# Patient Record
Sex: Male | Born: 1935
Health system: Southern US, Community
[De-identification: ages and names within clinical notes are randomized; demographics above are authoritative.]

## PROBLEM LIST (undated history)

## (undated) DIAGNOSIS — I4892 Unspecified atrial flutter: Secondary | ICD-10-CM

## (undated) DIAGNOSIS — I10 Essential (primary) hypertension: Secondary | ICD-10-CM

## (undated) DIAGNOSIS — K219 Gastro-esophageal reflux disease without esophagitis: Secondary | ICD-10-CM

## (undated) DIAGNOSIS — E119 Type 2 diabetes mellitus without complications: Secondary | ICD-10-CM

## (undated) DIAGNOSIS — M47812 Spondylosis without myelopathy or radiculopathy, cervical region: Secondary | ICD-10-CM

## (undated) DIAGNOSIS — U071 COVID-19: Secondary | ICD-10-CM

## (undated) DIAGNOSIS — C61 Malignant neoplasm of prostate: Secondary | ICD-10-CM

## (undated) DIAGNOSIS — E785 Hyperlipidemia, unspecified: Secondary | ICD-10-CM

## (undated) DIAGNOSIS — K449 Diaphragmatic hernia without obstruction or gangrene: Secondary | ICD-10-CM

## (undated) HISTORY — DX: Essential (primary) hypertension: I10

## (undated) HISTORY — DX: Hyperlipidemia, unspecified: E78.5

---
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
## 1999-12-10 DIAGNOSIS — C61 Malignant neoplasm of prostate: Secondary | ICD-10-CM

## 1999-12-10 HISTORY — DX: Malignant neoplasm of prostate: C61

## 1999-12-10 HISTORY — PX: INSERTION PROSTATE RADIATION SEED: SUR718

## 2000-08-06 ENCOUNTER — Ambulatory Visit (HOSPITAL_COMMUNITY): Admission: RE | Admit: 2000-08-06 | Discharge: 2000-08-06 | Payer: Self-pay | Admitting: Gastroenterology

## 2001-08-24 ENCOUNTER — Other Ambulatory Visit: Admission: RE | Admit: 2001-08-24 | Discharge: 2001-08-24 | Payer: Self-pay | Admitting: Urology

## 2001-08-24 ENCOUNTER — Encounter (INDEPENDENT_AMBULATORY_CARE_PROVIDER_SITE_OTHER): Payer: Self-pay | Admitting: Specialist

## 2001-09-22 ENCOUNTER — Ambulatory Visit: Admission: RE | Admit: 2001-09-22 | Discharge: 2001-12-21 | Payer: Self-pay | Admitting: Radiation Oncology

## 2011-01-09 ENCOUNTER — Encounter: Payer: Self-pay | Admitting: Internal Medicine

## 2011-01-16 DIAGNOSIS — I10 Essential (primary) hypertension: Secondary | ICD-10-CM

## 2011-01-16 HISTORY — DX: Essential (primary) hypertension: I10

## 2011-04-26 NOTE — Procedures (Signed)
[facility]  Patient:    Charles Boyer, Charles Boyer                        MRN: [id number] Proc. Date: 08/06/00 Adm. Date:  [id number] Disc. Date: [id number] Attending:  Louie Bun CC:         Janae Bridgeman Eloise Harman., M.D.                           Procedure Report  PROCEDURE:  Colonoscopy.  INDICATION FOR PROCEDURE:  Hemoccult positive stools.  DESCRIPTION OF PROCEDURE:  The patient was placed in the left lateral decubitus position and placed on the pulse monitor with continuous low-flow oxygen delivered by nasal cannula.  He was sedated with 50 mg IV Demerol and 5 mg IV Versed.  The Olympus video colonoscope was inserted into the rectum and advanced to the cecum, confirmed by transillumination at McBurneys point and visualization of the ileocecal valve and appendiceal orifice.  The prep was excellent.  The cecum, ascending, transverse, descending, and sigmoid colon all appeared normal with no masses, polyps, diverticula, or other mucosal abnormalities.  The rectum likewise appeared normal, and retroflex view of the anus revealed some small internal hemorrhoids.  The colonoscope was then withdrawn and the patient returned to the recovery room in stable condition. He tolerated the procedure well, and there were no immediate complications.  IMPRESSION:  Internal hemorrhoids, otherwise normal colonoscopy. DD:  08/06/00 TD:  08/07/00 Job: [id number]

## 2013-02-07 ENCOUNTER — Encounter: Payer: Self-pay | Admitting: *Deleted

## 2013-03-04 ENCOUNTER — Encounter: Payer: Self-pay | Admitting: Cardiovascular Disease

## 2013-05-30 ENCOUNTER — Other Ambulatory Visit: Payer: Self-pay | Admitting: Cardiovascular Disease

## 2013-06-01 NOTE — Telephone Encounter (Signed)
Rx was sent to pharmacy electronically. 

## 2014-10-24 ENCOUNTER — Emergency Department (HOSPITAL_COMMUNITY): Payer: Medicare Other

## 2014-10-24 ENCOUNTER — Inpatient Hospital Stay (HOSPITAL_COMMUNITY): Payer: Medicare Other

## 2014-10-24 ENCOUNTER — Inpatient Hospital Stay (HOSPITAL_COMMUNITY)
Admission: EM | Admit: 2014-10-24 | Discharge: 2014-10-26 | DRG: 309 | Disposition: A | Discharge status: Home / Self Care | Payer: Medicare Other | Attending: Cardiology | Admitting: Cardiology

## 2014-10-24 ENCOUNTER — Encounter (HOSPITAL_COMMUNITY): Payer: Self-pay | Admitting: *Deleted

## 2014-10-24 DIAGNOSIS — E871 Hypo-osmolality and hyponatremia: Secondary | ICD-10-CM | POA: Diagnosis present

## 2014-10-24 DIAGNOSIS — R109 Unspecified abdominal pain: Secondary | ICD-10-CM

## 2014-10-24 DIAGNOSIS — M47812 Spondylosis without myelopathy or radiculopathy, cervical region: Secondary | ICD-10-CM | POA: Diagnosis present

## 2014-10-24 DIAGNOSIS — E039 Hypothyroidism, unspecified: Secondary | ICD-10-CM | POA: Diagnosis present

## 2014-10-24 DIAGNOSIS — R079 Chest pain, unspecified: Secondary | ICD-10-CM

## 2014-10-24 DIAGNOSIS — M47892 Other spondylosis, cervical region: Secondary | ICD-10-CM | POA: Diagnosis present

## 2014-10-24 DIAGNOSIS — I4892 Unspecified atrial flutter: Secondary | ICD-10-CM | POA: Diagnosis present

## 2014-10-24 DIAGNOSIS — Z7982 Long term (current) use of aspirin: Secondary | ICD-10-CM

## 2014-10-24 DIAGNOSIS — K449 Diaphragmatic hernia without obstruction or gangrene: Secondary | ICD-10-CM | POA: Diagnosis present

## 2014-10-24 DIAGNOSIS — I4891 Unspecified atrial fibrillation: Principal | ICD-10-CM | POA: Diagnosis present

## 2014-10-24 DIAGNOSIS — R55 Syncope and collapse: Secondary | ICD-10-CM | POA: Diagnosis present

## 2014-10-24 DIAGNOSIS — Z79899 Other long term (current) drug therapy: Secondary | ICD-10-CM

## 2014-10-24 DIAGNOSIS — E119 Type 2 diabetes mellitus without complications: Secondary | ICD-10-CM | POA: Diagnosis present

## 2014-10-24 DIAGNOSIS — Z923 Personal history of irradiation: Secondary | ICD-10-CM | POA: Diagnosis not present

## 2014-10-24 DIAGNOSIS — E785 Hyperlipidemia, unspecified: Secondary | ICD-10-CM | POA: Diagnosis present

## 2014-10-24 DIAGNOSIS — D72829 Elevated white blood cell count, unspecified: Secondary | ICD-10-CM | POA: Diagnosis present

## 2014-10-24 DIAGNOSIS — W19XXXA Unspecified fall, initial encounter: Secondary | ICD-10-CM

## 2014-10-24 DIAGNOSIS — K760 Fatty (change of) liver, not elsewhere classified: Secondary | ICD-10-CM | POA: Diagnosis present

## 2014-10-24 DIAGNOSIS — Z8546 Personal history of malignant neoplasm of prostate: Secondary | ICD-10-CM

## 2014-10-24 DIAGNOSIS — I10 Essential (primary) hypertension: Secondary | ICD-10-CM | POA: Diagnosis present

## 2014-10-24 HISTORY — DX: Spondylosis without myelopathy or radiculopathy, cervical region: M47.812

## 2014-10-24 HISTORY — DX: Gastro-esophageal reflux disease without esophagitis: K21.9

## 2014-10-24 HISTORY — DX: Type 2 diabetes mellitus without complications: E11.9

## 2014-10-24 HISTORY — DX: Diaphragmatic hernia without obstruction or gangrene: K44.9

## 2014-10-24 HISTORY — DX: Malignant neoplasm of prostate: C61

## 2014-10-24 HISTORY — DX: Unspecified atrial flutter: I48.92

## 2014-10-24 LAB — CBC
HCT: 45.5 % (ref 39.0–52.0)
Hemoglobin: 15.3 g/dL (ref 13.0–17.0)
MCH: 31.4 pg (ref 26.0–34.0)
MCHC: 33.6 g/dL (ref 30.0–36.0)
MCV: 93.4 fL (ref 78.0–100.0)
PLATELETS: 167 10*3/uL (ref 150–400)
RBC: 4.87 MIL/uL (ref 4.22–5.81)
RDW: 13.6 % (ref 11.5–15.5)
WBC: 13.4 10*3/uL — AB (ref 4.0–10.5)

## 2014-10-24 LAB — BASIC METABOLIC PANEL
ANION GAP: 14 (ref 5–15)
BUN: 17 mg/dL (ref 6–23)
CHLORIDE: 97 meq/L (ref 96–112)
CO2: 21 meq/L (ref 19–32)
Calcium: 9.1 mg/dL (ref 8.4–10.5)
Creatinine, Ser: 1.45 mg/dL — ABNORMAL HIGH (ref 0.50–1.35)
GFR calc Af Amer: 51 mL/min — ABNORMAL LOW (ref >90)
GFR calc non Af Amer: 44 mL/min — ABNORMAL LOW (ref >90)
Glucose, Bld: 223 mg/dL — ABNORMAL HIGH (ref 70–99)
POTASSIUM: 4.4 meq/L (ref 3.7–5.3)
SODIUM: 132 meq/L — AB (ref 137–147)

## 2014-10-24 LAB — PROTIME-INR
INR: 1.16 (ref 0.00–1.49)
Prothrombin Time: 14.9 seconds (ref 11.6–15.2)

## 2014-10-24 LAB — I-STAT TROPONIN, ED
TROPONIN I, POC: 0 ng/mL (ref 0.00–0.08)
Troponin i, poc: 0 ng/mL (ref 0.00–0.08)

## 2014-10-24 LAB — CBG MONITORING, ED: Glucose-Capillary: 176 mg/dL — ABNORMAL HIGH (ref 70–99)

## 2014-10-24 LAB — HEPATIC FUNCTION PANEL
ALBUMIN: 3.4 g/dL — AB (ref 3.5–5.2)
ALK PHOS: 48 U/L (ref 39–117)
ALT: 36 U/L (ref 0–53)
AST: 38 U/L — ABNORMAL HIGH (ref 0–37)
BILIRUBIN INDIRECT: 1 mg/dL — AB (ref 0.3–0.9)
Bilirubin, Direct: 0.2 mg/dL (ref 0.0–0.3)
Total Bilirubin: 1.2 mg/dL (ref 0.3–1.2)
Total Protein: 7 g/dL (ref 6.0–8.3)

## 2014-10-24 LAB — D-DIMER, QUANTITATIVE: D-Dimer, Quant: 0.8 ug/mL-FEU — ABNORMAL HIGH (ref 0.00–0.48)

## 2014-10-24 LAB — APTT: APTT: 29 s (ref 24–37)

## 2014-10-24 LAB — TROPONIN I: Troponin I: <0.3 ng/mL (ref <0.30)

## 2014-10-24 LAB — LIPASE, BLOOD: Lipase: 33 U/L (ref 11–59)

## 2014-10-24 LAB — MRSA PCR SCREENING: MRSA by PCR: NEGATIVE

## 2014-10-24 LAB — TSH: TSH: 4.13 u[IU]/mL (ref 0.350–4.500)

## 2014-10-24 MED ORDER — DILTIAZEM LOAD VIA INFUSION
15.0000 mg | Freq: Once | INTRAVENOUS | Status: AC
  Administered 2014-10-24: 15 mg via INTRAVENOUS
  Filled 2014-10-24: qty 15

## 2014-10-24 MED ORDER — ACETAMINOPHEN 325 MG PO TABS
650.0000 mg | ORAL_TABLET | ORAL | Status: DC | PRN
  Administered 2014-10-25 (×2): 650 mg via ORAL
  Filled 2014-10-24 (×2): qty 2

## 2014-10-24 MED ORDER — HEPARIN (PORCINE) IN NACL 100-0.45 UNIT/ML-% IJ SOLN
1200.0000 [IU]/h | INTRAMUSCULAR | Status: AC
  Administered 2014-10-24 – 2014-10-25 (×2): 1200 [IU]/h via INTRAVENOUS
  Filled 2014-10-24 (×2): qty 250

## 2014-10-24 MED ORDER — INSULIN ASPART 100 UNIT/ML ~~LOC~~ SOLN
0.0000 [IU] | Freq: Three times a day (TID) | SUBCUTANEOUS | Status: DC
Start: 2014-10-24 — End: 2014-10-26
  Administered 2014-10-24: 3 [IU] via SUBCUTANEOUS
  Administered 2014-10-25: 2 [IU] via SUBCUTANEOUS
  Administered 2014-10-25: 3 [IU] via SUBCUTANEOUS
  Administered 2014-10-25 – 2014-10-26 (×2): 2 [IU] via SUBCUTANEOUS
  Filled 2014-10-24: qty 1

## 2014-10-24 MED ORDER — HYDROCODONE-ACETAMINOPHEN 5-325 MG PO TABS
1.0000 | ORAL_TABLET | ORAL | Status: DC | PRN
  Administered 2014-10-24 (×2): 2 via ORAL
  Filled 2014-10-24 (×2): qty 2

## 2014-10-24 MED ORDER — DILTIAZEM HCL 100 MG IV SOLR
5.0000 mg/h | INTRAVENOUS | Status: AC
  Administered 2014-10-24: 10 mg/h via INTRAVENOUS
  Administered 2014-10-24: 5 mg/h via INTRAVENOUS
  Administered 2014-10-25 (×2): 10 mg/h via INTRAVENOUS

## 2014-10-24 MED ORDER — NITROGLYCERIN 0.4 MG SL SUBL: 0.4000 mg | SUBLINGUAL_TABLET | SUBLINGUAL | Status: DC | PRN

## 2014-10-24 MED ORDER — HEPARIN BOLUS VIA INFUSION
4000.0000 [IU] | Freq: Once | INTRAVENOUS | Status: AC
  Administered 2014-10-24: 4000 [IU] via INTRAVENOUS
  Filled 2014-10-24: qty 4000

## 2014-10-24 MED ORDER — ASPIRIN 81 MG PO CHEW
324.0000 mg | CHEWABLE_TABLET | Freq: Once | ORAL | Status: AC
Start: 2014-10-24 — End: 2014-10-24
  Administered 2014-10-24: 324 mg via ORAL
  Filled 2014-10-24: qty 4

## 2014-10-24 MED ORDER — HYDROMORPHONE HCL 1 MG/ML IJ SOLN
1.0000 mg | Freq: Once | INTRAMUSCULAR | Status: AC
  Administered 2014-10-24: 1 mg via INTRAVENOUS
  Filled 2014-10-24: qty 1

## 2014-10-24 MED ORDER — SODIUM CHLORIDE 0.9 % IJ SOLN
3.0000 mL | Freq: Two times a day (BID) | INTRAMUSCULAR | Status: DC
Start: 2014-10-24 — End: 2014-10-26
  Administered 2014-10-26: 3 mL via INTRAVENOUS

## 2014-10-24 MED ORDER — CHLORHEXIDINE GLUCONATE CLOTH 2 % EX PADS
6.0000 | MEDICATED_PAD | Freq: Once | CUTANEOUS | Status: AC
  Administered 2014-10-24: 6 via TOPICAL

## 2014-10-24 MED ORDER — SODIUM CHLORIDE 0.9 % IV BOLUS (SEPSIS)
500.0000 mL | Freq: Once | INTRAVENOUS | Status: AC
  Administered 2014-10-24: 500 mL via INTRAVENOUS

## 2014-10-24 MED ORDER — SODIUM CHLORIDE 0.9 % IV SOLN
1000.0000 mL | INTRAVENOUS | Status: DC
  Administered 2014-10-24 – 2014-10-25 (×2): 1000 mL via INTRAVENOUS

## 2014-10-24 MED ORDER — PANTOPRAZOLE SODIUM 40 MG PO TBEC
40.0000 mg | DELAYED_RELEASE_TABLET | Freq: Every day | ORAL | Status: DC
  Administered 2014-10-24 – 2014-10-25 (×2): 40 mg via ORAL
  Filled 2014-10-24 (×2): qty 1

## 2014-10-24 MED ORDER — ONDANSETRON HCL 4 MG/2ML IJ SOLN: 4.0000 mg | Freq: Four times a day (QID) | INTRAMUSCULAR | Status: DC | PRN

## 2014-10-24 MED ORDER — INSULIN ASPART 100 UNIT/ML ~~LOC~~ SOLN
0.0000 [IU] | Freq: Every day | SUBCUTANEOUS | Status: DC
  Administered 2014-10-25: 2 [IU] via SUBCUTANEOUS

## 2014-10-24 MED ORDER — SODIUM CHLORIDE 0.9 % IJ SOLN: 3.0000 mL | INTRAMUSCULAR | Status: DC | PRN

## 2014-10-24 MED ORDER — IOHEXOL 350 MG/ML SOLN
100.0000 mL | Freq: Once | INTRAVENOUS | Status: AC | PRN
  Administered 2014-10-24: 100 mL via INTRAVENOUS

## 2014-10-24 MED ORDER — ZOLPIDEM TARTRATE 5 MG PO TABS: 5.0000 mg | ORAL_TABLET | Freq: Every evening | ORAL | Status: DC | PRN

## 2014-10-24 MED ORDER — ALPRAZOLAM 0.25 MG PO TABS: 0.2500 mg | ORAL_TABLET | Freq: Two times a day (BID) | ORAL | Status: DC | PRN

## 2014-10-24 MED ORDER — ALUM & MAG HYDROXIDE-SIMETH 200-200-20 MG/5ML PO SUSP
30.0000 mL | Freq: Four times a day (QID) | ORAL | Status: DC | PRN
  Administered 2014-10-24 – 2014-10-25 (×2): 30 mL via ORAL
  Filled 2014-10-24 (×2): qty 30

## 2014-10-24 MED ORDER — SODIUM CHLORIDE 0.9 % IV SOLN: 250.0000 mL | INTRAVENOUS | Status: DC | PRN

## 2014-10-24 MED ORDER — MORPHINE SULFATE 4 MG/ML IJ SOLN
4.0000 mg | Freq: Once | INTRAMUSCULAR | Status: AC
  Administered 2014-10-24: 4 mg via INTRAVENOUS
  Filled 2014-10-24: qty 1

## 2014-10-24 NOTE — ED Notes (Signed)
Pt reports onset this am of sharp mid chest pains, denies recent cough. Pt reports getting up this am and had syncopal episode. Abrasion noted to right side of forehead. ekg done at triage, HR 140 at triage.

## 2014-10-24 NOTE — ED Notes (Signed)
Notified Dr. Tomi Bamberger pt's BP soft- 90/61. Orders to give 561mL bolus and continue cardiazem drip

## 2014-10-24 NOTE — H&P (Signed)
History and Physical  Patient ID: ZAVIAN SLOWEY MRN: [id number], DOB: 1932-06-18 Date of Encounter: 10/24/2014, 1:34 PM Primary Physician: Jani Gravel, MD Primary Cardiologist: Gwenlyn Found - not seen since 2013  Chief Complaint: chest pain and syncope Reason for Admission: new onset atrial fibrillation  HPI:  Charles Boyer is a 80 y.o. male with a past medical history significant for diabetes, hypertension, hyperlipidemia, and hypothyroidism who presents for evaluation after a syncopal spell this morning with a preceding 3 week history of nausea and chest pain.   He was last evaluated by Dr Gwenlyn Found in 2013 with a myoview which demonstrated EF 74% and no significant ischemia.    3 weeks ago, he ate out a restaurant and had an episode of vomiting following that meal.  Since that time, he has had persistent nausea and midsternal chest pain described as a "brick on his chest" that did not resolve with rest or worsen with activity.  He has a known hiatal hernia that he felt caused his symptoms.  This morning around 4AM, he awoke feeling like he needed to vomit, he stood up to go to the restroom and abruptly lost consciousness.  He fell and hit his head.  He is unsure how long he was unconscious.  When he awoke, he had not vomited, lost bowel or bladder control.  His sensation of nausea had resolved.  He came to the ER for further evaluation.    On arrival, he was found to be in new onset atrial fibrillation with RVR.  He has been placed on Cardizem drip with improvement in heart rates.  Since his syncopal spell, he has had a sharp midsternal chest pain that rates 10/10 different from the pain he was having over the last 3 weeks.  This has been improved with Dilauded.   He has chronic dyspnea on exertion, but remains physically active still working.  Prior to 3 weeks ago,he had no symptoms of chest pain with exertion.  He has had no prior syncope.   Past Medical History  Diagnosis Date  . Dyslipidemia   .  Hypertension 01/16/11    ECHOCARDIOGRAM-NORMAL EF>55% STRESS  MYOCARDIAL PERFUSION 06/29/08 NORMAL.  . Type II diabetes mellitus   . Hiatal hernia   . Prostate cancer 2001    rx w/ seed implantation     Most Recent Cardiac Studies: Myoview 2013- EF 74% with no ischemia   Surgical History:  Past Surgical History  Procedure Laterality Date  . Insertion prostate radiation seed  2001     Home Meds: Prior to Admission medications   Medication Sig Start Date End Date Taking? Authorizing Provider  aspirin 325 MG EC tablet Take 325 mg by mouth daily.   Yes Historical Provider, MD  l-methylfolate-B6-B12 (METANX) 3-35-2 MG TABS Take 1 tablet by mouth 2 (two) times daily.   Yes Historical Provider, MD  LEVOTHYROXINE SODIUM PO Take 0.5 tablets by mouth once a week. On Sunday.     Yes Historical Provider, MD    Allergies:  Allergies  Allergen Reactions  . Byetta 10 Mcg Pen [Exenatide]   . Prednisone Other (See Comments)    MEMORY ISSUES. HALLUCINATIONS.  Marland Kitchen Corticosteroids Other (See Comments)    HALLUCINATIONS, MEMORY ISSUES    History   Social History  . Marital Status: Married    Spouse Name: N/A    Number of Children: N/A  . Years of Education: N/A   Occupational History  . Has [employer]  Social History Main Topics  . Smoking status: Never Smoker   . Smokeless tobacco: Never Used  . Alcohol Use: 0.0 oz/week    0 Not specified per week     Comment: 1-2 drinks/week  . Drug Use: No  . Sexual Activity: Not on file   Other Topics Concern  . Not on file   Social History Narrative   Lives with wife     Family History  Problem Relation Age of Onset  . Cancer - Ovarian    . Heart attack      70's    Review of Systems: General: negative for chills, fever, night sweats or weight changes.  Cardiovascular: negative for edema, orthopnea, paroxysmal nocturnal dyspnea Dermatological: negative for rash Respiratory: negative for cough or wheezing Urologic: negative for  hematuria Abdominal: positive for dysphagia, negative for diarrhea, bright red blood per rectum, melena, or hematemesis Neurologic: negative for visual changes or dizziness All other systems reviewed and are otherwise negative except as noted above.  Labs:   Lab Results  Component Value Date   WBC 13.4* 10/24/2014   HGB 15.3 10/24/2014   HCT 45.5 10/24/2014   MCV 93.4 10/24/2014   PLT 167 10/24/2014     Recent Labs Lab 10/24/14 0955  NA 132*  K 4.4  CL 97  CO2 21  BUN 17  CREATININE 1.45*  CALCIUM 9.1  GLUCOSE 223*    Radiology/Studies:  Ct Head Wo Contrast 10/24/2014   CLINICAL DATA:  Syncope this morning involving a fall with abrasion along the right side of the forehead. Onset this morning of chest pain.  EXAM: CT HEAD WITHOUT CONTRAST  CT CERVICAL SPINE WITHOUT CONTRAST  TECHNIQUE: Multidetector CT imaging of the head and cervical spine was performed following the standard protocol without intravenous contrast. Multiplanar CT image reconstructions of the cervical spine were also generated.  COMPARISON:  None.  FINDINGS: CT HEAD FINDINGS  Focal encephalomalacia in the left frontal lobe has a chronic appearance on images 14-15 of series 2. Periventricular white matter and corona radiata hypodensities favor chronic ischemic microvascular white matter disease. Otherwise, The brainstem, cerebellum, cerebral peduncles, thalamus, basal ganglia, basilar cisterns, and ventricular system appear within normal limits. No intracranial hemorrhage, mass lesion, or acute CVA. Chronic bilateral maxillary and ethmoid sinusitis. Right frontal scalp soft tissue swelling and infiltrative edema.  CT CERVICAL SPINE FINDINGS  Considerable cervical spondylosis with fused facet joints bilaterally at C2-3 and on the left at C4-5. Extensive degenerative multilevel facet arthropathy. 2 mm of grade 1 anterolisthesis of C3 on C4 is attributed to the severe facet arthropathy at this level.  Uncinate and facet  spurring cause osseous foraminal stenosis on the right at C3-4 and C5-6 ; and on the left at C5-6. Posterior osseous ridging at C4-5, C5-6, and C6-7.  No cervical spine fracture or acute subluxation identified. Bilateral carotid atherosclerotic calcification.  IMPRESSION: 1. No acute intracranial findings. 2. Focal left frontal encephalomalacia appears chronic. 3. Periventricular white matter and corona radiata hypodensities favor chronic ischemic microvascular white matter disease. 4. Cervical spondylosis causing osseous foraminal stenosis at several levels. No acute cervical spine findings. 5. Right frontal scalp soft tissue swelling.   Electronically Signed   By: Sherryl Barters M.D.   On: 10/24/2014 11:27   Dg Chest Port 1 View 10/24/2014   CLINICAL DATA:  Chest pain, tachycardia and syncopal episodes.  EXAM: PORTABLE CHEST - 1 VIEW  COMPARISON:  None.  FINDINGS: Normal heart size. No pleural effusion or  edema identified. Atelectasis is identified within the left lung base.  IMPRESSION: 1. Left base atelectasis.   Electronically Signed   By: Kerby Moors M.D.   On: 10/24/2014 10:21     EKG: coarse atrial fibrillation, ventricular rate 140, QRS 78  Physical Exam: Filed Vitals:   10/24/14 1115 10/24/14 1146 10/24/14 1200 10/24/14 1215  BP: 90/61 102/66 99/65 118/66  Pulse: 72  70 64  Temp:      TempSrc:      Resp: _0 SpO2: 93%  96% 93%   General: Well developed, well nourished, in no acute distress. Head: Normocephalic, atraumatic, sclera non-icteric, no xanthomas, nares are without discharge.  Neck: Negative for carotid bruits. JVD not elevated. Lungs: Clear bilaterally to auscultation without wheezes, rales, or rhonchi. Breathing is unlabored. Some Chest wall tenderness Heart: IRRR with S1 S2. No murmurs, rubs, or gallops appreciated. Abdomen: Soft, non-tender, non-distended with normoactive bowel sounds. No hepatomegaly. No rebound/guarding. No obvious abdominal masses. Msk:   Strength and tone appear normal for age. Extremities: No clubbing or cyanosis. No edema.  Distal pedal pulses are 2+ and equal in all 4 extrem bilaterally. Neuro: Alert and oriented X 3. No focal deficit. No facial asymmetry. Moves all extremities spontaneously. Psych:  Responds to questions appropriately with a normal affect.    ASSESSMENT AND PLAN:  1.  New onset atrial fibrillation CHADS2VASC is 6 Will need systemic anticoagulation once ACS ruled out Continue Cardizem drip; titrate as needed for rate control Consider TEE/DCCV prior to discharge   2.  Chest pain Initial POC troponin negative, continue to cycle troponin D-dimer elevated at 0.80 - chest CT pending to R/O PE Heparin  Echo   3. Syncope No driving for 6 months per [facility] Question related to post termination pauses/tachycardia  4.  Hypertension Stable  5.  Elevated glucose Check A1C  6.  Hyperlipidemia Not currently on therapy Fasting lipids in AM  7. Possible GI symptoms Add PPI, f/u with Dr Amedeo Plenty  Signed, Chanetta Marshall, NP student  Patient seen and examined with NP-S Chanetta Marshall. Changes made where appropriate.  Rosaria Ferries, PA-C 10/24/2014 1:34 PM Beeper [phone]  Patient seen and examined and history reviewed. Agree with above findings and plan. 80 yo WM presents with a syncopal episode and new atrial fibrillation. He complains of pain in lower sternal region for the past 3 weeks with a lot of belching. This am complained of nasuea and quickly got up to vomit when he passed out. Found to be in AFib with RVR. No prior history of arrhythmia. Long history of hiatal hernia. Exam reveals irregular pulse. Lungs are clear. No gallop or murmur. Troponin is negative. Chest pain is very atypical and more consistent with GI source. Will start PPI. Will continue IV cardizem for rate control of afib. Start heparin for anticoagulation. With high [place] score will need long term anticoagulation but will cycle cardiac  enzymes to make sure he doesn't need a cath. Check Echo and CTA chest. If afib persists would start oral anticoagulation tomorrow and consider TEE guided DCCV this admit.   Sloan Galentine [place], [place] 10/24/2014 1:49 PM

## 2014-10-24 NOTE — Progress Notes (Signed)
ANTICOAGULATION CONSULT NOTE - Initial Consult  Pharmacy Consult for Heparin  Indication: atrial fibrillation  Allergies  Allergen Reactions  . Byetta 10 Mcg Pen [Exenatide]   . Prednisone Other (See Comments)    MEMORY ISSUES. HALLUCINATIONS.  Marland Kitchen Corticosteroids Other (See Comments)    HALLUCINATIONS, MEMORY ISSUES    Patient Measurements: Height: 5\' 9"  (175.3 cm) Weight: 188 lb 4.4 oz (85.4 kg) IBW/kg (Calculated) : 70.7 Heparin Dosing Weight: 85 kg  Vital Signs: Temp: 98.7 F (37.1 C) (11/16 1756) Temp Source: Oral (11/16 1756) BP: 124/77 mmHg (11/16 1756) Pulse Rate: 128 (11/16 1756)  Labs:  Recent Labs  10/24/14 0955  HGB 15.3  HCT 45.5  PLT 167  APTT 29  LABPROT 14.9  INR 1.16  CREATININE 1.45*    Estimated Creatinine Clearance: 44 mL/min (by C-G formula based on Cr of 1.45).   Medical History: Past Medical History  Diagnosis Date  . Dyslipidemia   . Hypertension 01/16/11    ECHOCARDIOGRAM-NORMAL EF>55% STRESS  MYOCARDIAL PERFUSION 06/29/08 NORMAL.  . Type II diabetes mellitus   . Hiatal hernia   . Prostate cancer 2001    rx w/ seed implantation    Medications:  Prescriptions prior to admission  Medication Sig Dispense Refill Last Dose  . aspirin 325 MG EC tablet Take 325 mg by mouth daily.   10/23/2014 at Unknown time  . l-methylfolate-B6-B12 (METANX) 3-35-2 MG TABS Take 1 tablet by mouth 2 (two) times daily.   10/23/2014 at Unknown time  . LEVOTHYROXINE SODIUM PO Take 0.5 tablets by mouth once a week. On Sunday.  Samples from md   Past Month at Unknown time    Assessment: 12 YOM presents status post syncopal spell and 3 week history of nausea and chest pain found to have new onset atrial fibrillation to start IV heparin per pharmacy dosing. Note CT Angio was negative for pulmonary embolism. H/H are within normal limits. Platelets are on the low side at 167 on admission. INR is 1.16 at baseline. SCR is 1.45 with estimated CrCl ~ 40-45 mL/min. No  bleeding is noted. Patient was not on anticoagulants prior to admission.   Goal of Therapy:  Heparin level 0.3-0.7 units/ml Monitor platelets by anticoagulation protocol: Yes   Plan:  1. Heparin bolus of 4000 units x1.  2. Heparin drip at 1200 units/hr.  3. Heparin level in 8 hours.  4. Daily heparin level and CBC while on therapy.    Sloan Leiter, PharmD, BCPS Clinical Pharmacist [phone] 10/24/2014,6:41 PM

## 2014-10-24 NOTE — ED Provider Notes (Addendum)
CSN: [id number]     Arrival date & time 10/24/14  0932 History   First MD Initiated Contact with Patient 10/24/14 [phone]     Chief Complaint  Patient presents with  . Chest Pain  . Loss of Consciousness    HPI He thinks the symptoms first started with chest pain after eating some food a few weeks ago.  It has not gotten any better however in the last few days the symptoms have gotten worse.  Yesterday he had a fainting spell and hit his head.  The pain is a soreness and when he breathes he gets a sharp pain in his chest.  No history of blood clots, or heart problems in the past.  He has not noticed his heart racing.   Past Medical History  Diagnosis Date  . Dyslipidemia   . Hypertension 01/16/11    ECHOCARDIOGRAM-NORMAL EF>55% STRESS  MYOCARDIAL PERFUSION 06/29/08 NORMAL.   History reviewed. No pertinent past surgical history. History reviewed. No pertinent family history. History  Substance Use Topics  . Smoking status: Not on file  . Smokeless tobacco: Not on file  . Alcohol Use: Not on file    Review of Systems  All other systems reviewed and are negative.     Allergies  Byetta 10 mcg pen; Prednisone; and Corticosteroids  Home Medications   Prior to Admission medications   Medication Sig Start Date End Date Taking? Authorizing Provider  aspirin 325 MG EC tablet Take 325 mg by mouth daily.   Yes Historical Provider, MD  l-methylfolate-B6-B12 (METANX) 3-35-2 MG TABS Take 1 tablet by mouth 2 (two) times daily.   Yes Historical Provider, MD  LEVOTHYROXINE SODIUM PO Take 0.5 tablets by mouth once a week. On Sunday.  Samples from md   Yes Historical Provider, MD   BP 102/66 mmHg  Pulse 72  Temp(Src) 98.1 F (36.7 C) (Oral)  Resp 21  SpO2 93% Physical Exam  Constitutional: He appears well-developed and well-nourished. No distress.  HENT:  Head: Normocephalic.  Right Ear: External ear normal.  Left Ear: External ear normal.  Small contusion abrasion right forehead   Eyes: Conjunctivae are normal. Right eye exhibits no discharge. Left eye exhibits no discharge. No scleral icterus.  Neck: Neck supple. No tracheal deviation present.  Cardiovascular: Intact distal pulses.  An irregularly irregular rhythm present. Tachycardia present.   Pulmonary/Chest: Effort normal and breath sounds normal. No stridor. No respiratory distress. He has no wheezes. He has no rales.  Abdominal: Soft. Bowel sounds are normal. He exhibits no distension. There is tenderness in the epigastric area. There is no rebound and no guarding.  Musculoskeletal: He exhibits tenderness. He exhibits no edema.  Tenderness to palpation midline cervical spine  Neurological: He is alert. He has normal strength. No cranial nerve deficit (no facial droop, extraocular movements intact, no slurred speech) or sensory deficit. He exhibits normal muscle tone. He displays no seizure activity. Coordination normal.  Skin: Skin is warm and dry. No rash noted.  Psychiatric: He has a normal mood and affect.  Nursing note and vitals reviewed.   ED Course  Procedures (including critical care time) Labs Review Labs Reviewed  CBC - Abnormal; Notable for the following:    WBC 13.4 (*)    All other components within normal limits  BASIC METABOLIC PANEL - Abnormal; Notable for the following:    Sodium 132 (*)    Glucose, Bld 223 (*)    Creatinine, Ser 1.45 (*)    GFR  calc non Af Amer 44 (*)    GFR calc Af Amer 51 (*)    All other components within normal limits  APTT  PROTIME-INR  LIPASE, BLOOD  D-DIMER, QUANTITATIVE  I-STAT TROPOININ, ED  I-STAT TROPOININ, ED    Imaging Review Ct Head Wo Contrast  10/24/2014   CLINICAL DATA:  Syncope this morning involving a fall with abrasion along the right side of the forehead. Onset this morning of chest pain.  EXAM: CT HEAD WITHOUT CONTRAST  CT CERVICAL SPINE WITHOUT CONTRAST  TECHNIQUE: Multidetector CT imaging of the head and cervical spine was performed  following the standard protocol without intravenous contrast. Multiplanar CT image reconstructions of the cervical spine were also generated.  COMPARISON:  None.  FINDINGS: CT HEAD FINDINGS  Focal encephalomalacia in the left frontal lobe has a chronic appearance on images 14-15 of series 2. Periventricular white matter and corona radiata hypodensities favor chronic ischemic microvascular white matter disease. Otherwise, The brainstem, cerebellum, cerebral peduncles, thalamus, basal ganglia, basilar cisterns, and ventricular system appear within normal limits. No intracranial hemorrhage, mass lesion, or acute CVA. Chronic bilateral maxillary and ethmoid sinusitis. Right frontal scalp soft tissue swelling and infiltrative edema.  CT CERVICAL SPINE FINDINGS  Considerable cervical spondylosis with fused facet joints bilaterally at C2-3 and on the left at C4-5. Extensive degenerative multilevel facet arthropathy. 2 mm of grade 1 anterolisthesis of C3 on C4 is attributed to the severe facet arthropathy at this level.  Uncinate and facet spurring cause osseous foraminal stenosis on the right at C3-4 and C5-6 ; and on the left at C5-6. Posterior osseous ridging at C4-5, C5-6, and C6-7.  No cervical spine fracture or acute subluxation identified. Bilateral carotid atherosclerotic calcification.  IMPRESSION: 1. No acute intracranial findings. 2. Focal left frontal encephalomalacia appears chronic. 3. Periventricular white matter and corona radiata hypodensities favor chronic ischemic microvascular white matter disease. 4. Cervical spondylosis causing osseous foraminal stenosis at several levels. No acute cervical spine findings. 5. Right frontal scalp soft tissue swelling.   Electronically Signed   By: Sherryl Barters M.D.   On: 10/24/2014 11:27   Ct Cervical Spine Wo Contrast  10/24/2014   CLINICAL DATA:  Syncope this morning involving a fall with abrasion along the right side of the forehead. Onset this morning of  chest pain.  EXAM: CT HEAD WITHOUT CONTRAST  CT CERVICAL SPINE WITHOUT CONTRAST  TECHNIQUE: Multidetector CT imaging of the head and cervical spine was performed following the standard protocol without intravenous contrast. Multiplanar CT image reconstructions of the cervical spine were also generated.  COMPARISON:  None.  FINDINGS: CT HEAD FINDINGS  Focal encephalomalacia in the left frontal lobe has a chronic appearance on images 14-15 of series 2. Periventricular white matter and corona radiata hypodensities favor chronic ischemic microvascular white matter disease. Otherwise, The brainstem, cerebellum, cerebral peduncles, thalamus, basal ganglia, basilar cisterns, and ventricular system appear within normal limits. No intracranial hemorrhage, mass lesion, or acute CVA. Chronic bilateral maxillary and ethmoid sinusitis. Right frontal scalp soft tissue swelling and infiltrative edema.  CT CERVICAL SPINE FINDINGS  Considerable cervical spondylosis with fused facet joints bilaterally at C2-3 and on the left at C4-5. Extensive degenerative multilevel facet arthropathy. 2 mm of grade 1 anterolisthesis of C3 on C4 is attributed to the severe facet arthropathy at this level.  Uncinate and facet spurring cause osseous foraminal stenosis on the right at C3-4 and C5-6 ; and on the left at C5-6. Posterior osseous ridging at C4-5, C5-6,  and C6-7.  No cervical spine fracture or acute subluxation identified. Bilateral carotid atherosclerotic calcification.  IMPRESSION: 1. No acute intracranial findings. 2. Focal left frontal encephalomalacia appears chronic. 3. Periventricular white matter and corona radiata hypodensities favor chronic ischemic microvascular white matter disease. 4. Cervical spondylosis causing osseous foraminal stenosis at several levels. No acute cervical spine findings. 5. Right frontal scalp soft tissue swelling.   Electronically Signed   By: Sherryl Barters M.D.   On: 10/24/2014 11:27   Dg Chest Port 1  View  10/24/2014   CLINICAL DATA:  Chest pain, tachycardia and syncopal episodes.  EXAM: PORTABLE CHEST - 1 VIEW  COMPARISON:  None.  FINDINGS: Normal heart size. No pleural effusion or edema identified. Atelectasis is identified within the left lung base.  IMPRESSION: 1. Left base atelectasis.   Electronically Signed   By: Kerby Moors M.D.   On: 10/24/2014 10:21     EKG Interpretation   Date/Time:  Monday October 24 2014 09:35:14 EST Ventricular Rate:  140 PR Interval:    QRS Duration: 78 QT Interval:  334 QTC Calculation: 509 R Axis:   -10 Text Interpretation:  Atrial flutter with variable A-V block Minimal  voltage criteria for LVH, may be normal variant Abnormal ECG No previous  tracing Confirmed by Jasai Sorg  MD-J, Anmol Fleck ([id number]) on 10/24/2014 9:52:42 AM     Medications  0.9 %  sodium chloride infusion (1,000 mLs Intravenous New Bag/Given 10/24/14 1007)  diltiazem (CARDIZEM) 1 mg/mL load via infusion 15 mg (0 mg Intravenous Stopped 10/24/14 1129)    And  diltiazem (CARDIZEM) 100 mg in dextrose 5 % 100 mL (1 mg/mL) infusion (5 mg/hr Intravenous New Bag/Given 10/24/14 1021)  aspirin chewable tablet 324 mg (324 mg Oral Given 10/24/14 1008)  morphine 4 MG/ML injection 4 mg (4 mg Intravenous Given 10/24/14 1009)  sodium chloride 0.9 % bolus 500 mL (500 mLs Intravenous New Bag/Given 10/24/14 1129)   CRITICAL CARE Performed by: KDTOI,ZTI Total critical care time: 40 Critical care time was exclusive of separately billable procedures and treating other patients. Critical care was necessary to treat or prevent imminent or life-threatening deterioration. Critical care was time spent personally by me on the following activities: development of treatment plan with patient and/or surrogate as well as nursing, discussions with consultants, evaluation of patient's response to treatment, examination of patient, obtaining history from patient or surrogate, ordering and performing treatments and  interventions, ordering and review of laboratory studies, ordering and review of radiographic studies, pulse oximetry and re-evaluation of patient's condition.  MDM   Final diagnoses:  Atrial flutter with rapid ventricular response  Syncope, unspecified syncope type   1204  Pt's heart rate has improved on drip.  Initial cardiac enzymes normal.  Still having intermittent sharp pain in his chest.  Waiting on LFTs and lipase as he did have abdominal ttp.  PE is a consideration as well.  Will CT chest to evaluate further.  No serious injury identified associated with his fall.  Plan on cardiac consultation and hospitalization    Dorie Rank, MD 10/24/14 Humphrey, MD 10/24/14 [phone]

## 2014-10-24 NOTE — ED Notes (Signed)
Ordered meal tray for pt 

## 2014-10-24 NOTE — Progress Notes (Signed)
Spoke with Dr. Tommi Rumps, on call for cardiology, pertaining to patient's recent complaint of 7/10 anterior, center, chest pain with inspiration.  EKG obtained and showed changes from previous EKG.  RN updated Dr. Tommi Rumps pertaining to recent PRNs given for pain, and recent blood pressure taken at about 2000.  Order received to repeat EKG at this time. Nurse tech notified per RN.

## 2014-10-25 ENCOUNTER — Inpatient Hospital Stay (HOSPITAL_COMMUNITY): Payer: Medicare Other

## 2014-10-25 ENCOUNTER — Encounter (HOSPITAL_COMMUNITY): Payer: Self-pay | Admitting: General Practice

## 2014-10-25 DIAGNOSIS — I4891 Unspecified atrial fibrillation: Secondary | ICD-10-CM

## 2014-10-25 DIAGNOSIS — M47812 Spondylosis without myelopathy or radiculopathy, cervical region: Secondary | ICD-10-CM

## 2014-10-25 DIAGNOSIS — K760 Fatty (change of) liver, not elsewhere classified: Secondary | ICD-10-CM | POA: Diagnosis present

## 2014-10-25 DIAGNOSIS — E119 Type 2 diabetes mellitus without complications: Secondary | ICD-10-CM

## 2014-10-25 DIAGNOSIS — D72829 Elevated white blood cell count, unspecified: Secondary | ICD-10-CM | POA: Diagnosis present

## 2014-10-25 DIAGNOSIS — E871 Hypo-osmolality and hyponatremia: Secondary | ICD-10-CM | POA: Diagnosis present

## 2014-10-25 HISTORY — DX: Spondylosis without myelopathy or radiculopathy, cervical region: M47.812

## 2014-10-25 LAB — URINALYSIS, ROUTINE W REFLEX MICROSCOPIC
Bilirubin Urine: NEGATIVE
Glucose, UA: NEGATIVE mg/dL
KETONES UR: NEGATIVE mg/dL
LEUKOCYTES UA: NEGATIVE
NITRITE: NEGATIVE
PH: 6 (ref 5.0–8.0)
Protein, ur: NEGATIVE mg/dL
Urobilinogen, UA: 0.2 mg/dL (ref 0.0–1.0)

## 2014-10-25 LAB — COMPREHENSIVE METABOLIC PANEL
ALK PHOS: 41 U/L (ref 39–117)
ALT: 29 U/L (ref 0–53)
ANION GAP: 14 (ref 5–15)
AST: 26 U/L (ref 0–37)
Albumin: 2.9 g/dL — ABNORMAL LOW (ref 3.5–5.2)
BILIRUBIN TOTAL: 1.3 mg/dL — AB (ref 0.3–1.2)
BUN: 22 mg/dL (ref 6–23)
CHLORIDE: 96 meq/L (ref 96–112)
CO2: 21 mEq/L (ref 19–32)
Calcium: 8.1 mg/dL — ABNORMAL LOW (ref 8.4–10.5)
Creatinine, Ser: 1.61 mg/dL — ABNORMAL HIGH (ref 0.50–1.35)
GFR calc non Af Amer: 39 mL/min — ABNORMAL LOW (ref >90)
GFR, EST AFRICAN AMERICAN: 45 mL/min — AB (ref >90)
Glucose, Bld: 144 mg/dL — ABNORMAL HIGH (ref 70–99)
POTASSIUM: 3.7 meq/L (ref 3.7–5.3)
Sodium: 131 mEq/L — ABNORMAL LOW (ref 137–147)
TOTAL PROTEIN: 6.3 g/dL (ref 6.0–8.3)

## 2014-10-25 LAB — CREATININE, URINE, RANDOM: Creatinine, Urine: 60.89 mg/dL

## 2014-10-25 LAB — URINE MICROSCOPIC-ADD ON

## 2014-10-25 LAB — OSMOLALITY, URINE: OSMOLALITY UR: 299 mosm/kg — AB (ref 390–1090)

## 2014-10-25 LAB — GLUCOSE, CAPILLARY
GLUCOSE-CAPILLARY: 215 mg/dL — AB (ref 70–99)
Glucose-Capillary: 132 mg/dL — ABNORMAL HIGH (ref 70–99)
Glucose-Capillary: 177 mg/dL — ABNORMAL HIGH (ref 70–99)
Glucose-Capillary: 128 mg/dL — ABNORMAL HIGH (ref 70–99)
Glucose-Capillary: 137 mg/dL — ABNORMAL HIGH (ref 70–99)

## 2014-10-25 LAB — TROPONIN I
Troponin I: <0.3 ng/mL (ref <0.30)
Troponin I: <0.3 ng/mL (ref <0.30)

## 2014-10-25 LAB — HEPARIN LEVEL (UNFRACTIONATED)
HEPARIN UNFRACTIONATED: 0.44 [IU]/mL (ref 0.30–0.70)
Heparin Unfractionated: 0.55 IU/mL (ref 0.30–0.70)

## 2014-10-25 LAB — SODIUM, URINE, RANDOM: Sodium, Ur: 29 mEq/L

## 2014-10-25 LAB — HEMOGLOBIN A1C
Hgb A1c MFr Bld: 7 % — ABNORMAL HIGH (ref <5.7)
Mean Plasma Glucose: 154 mg/dL — ABNORMAL HIGH (ref <117)

## 2014-10-25 MED ORDER — OFF THE BEAT BOOK
Freq: Once | Status: AC
  Administered 2014-10-25: 06:00:00
  Filled 2014-10-25: qty 1

## 2014-10-25 MED ORDER — SODIUM CHLORIDE 0.9 % IV SOLN
INTRAVENOUS | Status: DC
  Administered 2014-10-25: 18:00:00 via INTRAVENOUS

## 2014-10-25 MED ORDER — PANTOPRAZOLE SODIUM 40 MG PO TBEC
40.0000 mg | DELAYED_RELEASE_TABLET | Freq: Two times a day (BID) | ORAL | Status: DC
  Administered 2014-10-25 – 2014-10-26 (×3): 40 mg via ORAL
  Filled 2014-10-25 (×3): qty 1

## 2014-10-25 MED ORDER — PANTOPRAZOLE SODIUM 40 MG IV SOLR: 40.0000 mg | Freq: Two times a day (BID) | INTRAVENOUS | Status: DC

## 2014-10-25 MED ORDER — APIXABAN 2.5 MG PO TABS
2.5000 mg | ORAL_TABLET | Freq: Two times a day (BID) | ORAL | Status: DC
  Administered 2014-10-25 – 2014-10-26 (×3): 2.5 mg via ORAL
  Filled 2014-10-25 (×3): qty 1

## 2014-10-25 MED ORDER — DILTIAZEM HCL 60 MG PO TABS
60.0000 mg | ORAL_TABLET | Freq: Four times a day (QID) | ORAL | Status: DC
  Administered 2014-10-25 – 2014-10-26 (×5): 60 mg via ORAL
  Filled 2014-10-25 (×5): qty 1

## 2014-10-25 NOTE — Progress Notes (Signed)
ANTICOAGULATION CONSULT NOTE - Follow Up Consult  Pharmacy Consult for Heparin  Indication: atrial fibrillation  Allergies  Allergen Reactions  . Byetta 10 Mcg Pen [Exenatide]   . Prednisone Other (See Comments)    MEMORY ISSUES. HALLUCINATIONS.  Marland Kitchen Corticosteroids Other (See Comments)    HALLUCINATIONS, MEMORY ISSUES    Patient Measurements: Height: 5\' 9"  (175.3 cm) Weight: 188 lb 4.4 oz (85.4 kg) IBW/kg (Calculated) : 70.7  Vital Signs: Temp: 97.6 F (36.4 C) (11/16 2340) Temp Source: Oral (11/16 1756) BP: 121/55 mmHg (11/16 2340) Pulse Rate: 72 (11/16 2340)  Labs:  Recent Labs  10/24/14 0955 10/24/14 2039 10/25/14 0033 10/25/14 0236  HGB 15.3  --   --   --   HCT 45.5  --   --   --   PLT 167  --   --   --   APTT 29  --   --   --   LABPROT 14.9  --   --   --   INR 1.16  --   --   --   HEPARINUNFRC  --   --   --  0.55  CREATININE 1.45*  --   --   --   TROPONINI  --  <0.30 <0.30  --     Estimated Creatinine Clearance: 44 mL/min (by C-G formula based on Cr of 1.45).  Assessment: Heparin for afib. First HL is good at 0.55. Other labs as above.   Goal of Therapy:  Heparin level 0.3-0.7 units/ml Monitor platelets by anticoagulation protocol: Yes   Plan:  -Continue heparin at 1200 units/hr -1100 HL -Daily CBC/HL -Monitor for bleeding  Narda Bonds 10/25/2014,3:25 AM

## 2014-10-25 NOTE — Progress Notes (Signed)
CARE MANAGEMENT NOTE 10/25/2014  Patient:  Charles Boyer, Charles Boyer   Account Number:  [id number]  Date Initiated:  10/25/2014  Documentation initiated by:  GRAVES-BIGELOW,Niaomi Cartaya  Subjective/Objective Assessment:   Pt admitted for chest pain, syncope and  new onset atrial fibrillation. Continues on cardizem gtt. Per MD notes plan to Consider TEE/DCCV prior to discharge.     Action/Plan:   CM will monitor for disposition needs.   Anticipated DC Date:  10/27/2014   Anticipated DC Plan:  [facility]  CM consult      Choice offered to / List presented to:             Status of service:  In process, will continue to follow Medicare Important Message given?   (If response is "NO", the following Medicare IM given date fields will be blank) Date Medicare IM given:   Medicare IM given by:   Date Additional Medicare IM given:   Additional Medicare IM given by:    Discharge Disposition:    Per UR Regulation:  Reviewed for med. necessity/level of care/duration of stay  If discussed at [place] of Stay Meetings, dates discussed:    Comments:

## 2014-10-25 NOTE — Progress Notes (Signed)
Pt temp 100.2 orally, tylenol given and Katie PA notified.  Will continue to closely monitor.

## 2014-10-25 NOTE — Progress Notes (Signed)
UR Completed Shown Dissinger Graves-Bigelow, RN,BSN [phone]  

## 2014-10-25 NOTE — Progress Notes (Signed)
ANTICOAGULATION CONSULT NOTE - Initial Consult  Pharmacy Consult for heparin  Indication: atrial fibrillation  Allergies  Allergen Reactions  . Byetta 10 Mcg Pen [Exenatide]   . Prednisone Other (See Comments)    MEMORY ISSUES. HALLUCINATIONS.  Marland Kitchen Corticosteroids Other (See Comments)    HALLUCINATIONS, MEMORY ISSUES    Patient Measurements: Height: 5\' 9"  (175.3 cm) Weight: 188 lb 12.8 oz (85.639 kg) IBW/kg (Calculated) : 70.7 Heparin Dosing Weight: 85.6 kg  Vital Signs: Temp: 99.1 F (37.3 C) (11/17 1215) Temp Source: Oral (11/17 1215) BP: 128/58 mmHg (11/17 1215) Pulse Rate: 74 (11/17 1215)  Labs:  Recent Labs  10/24/14 0955 10/24/14 2039 10/25/14 0033 10/25/14 0236 10/25/14 0612 10/25/14 1100  HGB 15.3  --   --   --   --   --   HCT 45.5  --   --   --   --   --   PLT 167  --   --   --   --   --   APTT 29  --   --   --   --   --   LABPROT 14.9  --   --   --   --   --   INR 1.16  --   --   --   --   --   HEPARINUNFRC  --   --   --  0.55  --  0.44  CREATININE 1.45*  --   --   --  1.61*  --   TROPONINI  --  <0.30 <0.30  --  <0.30  --     Estimated Creatinine Clearance: 39.7 mL/min (by C-G formula based on Cr of 1.61).   Medical History: Past Medical History  Diagnosis Date  . Dyslipidemia   . Hypertension 01/16/11    ECHOCARDIOGRAM-NORMAL EF>55% STRESS  MYOCARDIAL PERFUSION 06/29/08 NORMAL.  Marland Kitchen Hiatal hernia   . Prostate cancer 2001    rx w/ seed implantation  . Dysrhythmia 10/24/2014    NEW ONSET ATRIAL FIBRILATION  . Type II diabetes mellitus     TYPE 2  . GERD (gastroesophageal reflux disease)     Medications:  Prescriptions prior to admission  Medication Sig Dispense Refill Last Dose  . aspirin 325 MG EC tablet Take 325 mg by mouth daily.   10/23/2014 at Unknown time  . l-methylfolate-B6-B12 (METANX) 3-35-2 MG TABS Take 1 tablet by mouth 2 (two) times daily.   10/23/2014 at Unknown time  . LEVOTHYROXINE SODIUM PO Take 0.5 tablets by mouth once a  week. On Sunday.  Samples from md   Past Month at Unknown time    Assessment: Heparin for new onset AFib. Heparin level now therapeutic x2, CBC remains stable.  CHADSVASc = 4.  Goal of Therapy:  Heparin level 0.3-0.7 units/ml Monitor platelets by anticoagulation protocol: Yes   Plan:  -Continue heparin at 1200 units/hr -Daily HL, CBC -Monitor plans for PO anticoagulation  -Monitor for s/sx bleeding   Hughes Better, PharmD, BCPS Clinical Pharmacist Pager: [phone] 10/25/2014 1:45 PM

## 2014-10-25 NOTE — Progress Notes (Signed)
  Echocardiogram 2D Echocardiogram has been performed.  Donata Clay 10/25/2014, 1:48 PM

## 2014-10-25 NOTE — Progress Notes (Addendum)
SUBJECTIVE:  Patient reports his chest pain and nausea are much improved today.  He reports he feels this is due to his HR being under better control as he noticed these resolved as his HR came down.  OBJECTIVE:   Vitals:   Filed Vitals:   10/24/14 2340 10/25/14 0400 10/25/14 0803 10/25/14 1215  BP: 121/55 111/64 102/60 128/58  Pulse: 72 72 72 74  Temp: 97.6 F (36.4 C) 98 F (36.7 C) 98.3 F (36.8 C) 99.1 F (37.3 C)  TempSrc:   Oral Oral  Resp: 16 20 19 23   Height:      Weight:  188 lb 12.8 oz (85.639 kg)    SpO2: 94% 92% 91% 91%   I&O's:   Intake/Output Summary (Last 24 hours) at 10/25/14 1329 Last data filed at 10/25/14 1300  Gross per 24 hour  Intake   1092 ml  Output    650 ml  Net    442 ml   TELEMETRY: Reviewed telemetry pt in A flutter rate controlled in 70s:     PHYSICAL EXAM General: Well developed, well nourished, in no acute distress Head:   Normal cephalic and atramatic  Lungs:  Clear bilaterally to auscultation. Heart:  Irregular S1 S2  No JVD.   Abdomen: abdomen soft and non-tender Msk:  Back normal,  Normal strength and tone for age. Extremities:  Trace edema.   Neuro: Alert and oriented. Psych:  Normal affect, responds appropriately Skin: No rash   LABS: Basic Metabolic Panel:  Recent Labs  10/24/14 0955 10/25/14 0612  NA 132* 131*  K 4.4 3.7  CL 97 96  CO2 21 21  GLUCOSE 223* 144*  BUN 17 22  CREATININE 1.45* 1.61*  CALCIUM 9.1 8.1*   Liver Function Tests:  Recent Labs  10/24/14 1240 10/25/14 0612  AST 38* 26  ALT 36 29  ALKPHOS 48 41  BILITOT 1.2 1.3*  PROT 7.0 6.3  ALBUMIN 3.4* 2.9*    Recent Labs  10/24/14 0955  LIPASE 33   CBC:  Recent Labs  10/24/14 0955  WBC 13.4*  HGB 15.3  HCT 45.5  MCV 93.4  PLT 167   Cardiac Enzymes:  Recent Labs  10/24/14 2039 10/25/14 0033 10/25/14 0612  TROPONINI <0.30 <0.30 <0.30   BNP: Invalid input(s): POCBNP D-Dimer:  Recent Labs  10/24/14 0955  DDIMER  0.80*   Hemoglobin A1C:  Recent Labs  10/24/14 2039  HGBA1C 7.0*   Fasting Lipid Panel: No results for input(s): CHOL, HDL, LDLCALC, TRIG, CHOLHDL, LDLDIRECT in the last 72 hours. Thyroid Function Tests:  Recent Labs  10/24/14 1355  TSH 4.130   Anemia Panel: No results for input(s): VITAMINB12, FOLATE, FERRITIN, TIBC, IRON, RETICCTPCT in the last 72 hours. Coag Panel:   Lab Results  Component Value Date   INR 1.16 10/24/2014    RADIOLOGY: Ct Head Wo Contrast  10/24/2014   CLINICAL DATA:  Syncope this morning involving a fall with abrasion along the right side of the forehead. Onset this morning of chest pain.  EXAM: CT HEAD WITHOUT CONTRAST  CT CERVICAL SPINE WITHOUT CONTRAST  TECHNIQUE: Multidetector CT imaging of the head and cervical spine was performed following the standard protocol without intravenous contrast. Multiplanar CT image reconstructions of the cervical spine were also generated.  COMPARISON:  None.  FINDINGS: CT HEAD FINDINGS  Focal encephalomalacia in the left frontal lobe has a chronic appearance on images 14-15 of series 2. Periventricular white matter and corona radiata hypodensities  favor chronic ischemic microvascular white matter disease. Otherwise, The brainstem, cerebellum, cerebral peduncles, thalamus, basal ganglia, basilar cisterns, and ventricular system appear within normal limits. No intracranial hemorrhage, mass lesion, or acute CVA. Chronic bilateral maxillary and ethmoid sinusitis. Right frontal scalp soft tissue swelling and infiltrative edema.  CT CERVICAL SPINE FINDINGS  Considerable cervical spondylosis with fused facet joints bilaterally at C2-3 and on the left at C4-5. Extensive degenerative multilevel facet arthropathy. 2 mm of grade 1 anterolisthesis of C3 on C4 is attributed to the severe facet arthropathy at this level.  Uncinate and facet spurring cause osseous foraminal stenosis on the right at C3-4 and C5-6 ; and on the left at C5-6.  Posterior osseous ridging at C4-5, C5-6, and C6-7.  No cervical spine fracture or acute subluxation identified. Bilateral carotid atherosclerotic calcification.  IMPRESSION: 1. No acute intracranial findings. 2. Focal left frontal encephalomalacia appears chronic. 3. Periventricular white matter and corona radiata hypodensities favor chronic ischemic microvascular white matter disease. 4. Cervical spondylosis causing osseous foraminal stenosis at several levels. No acute cervical spine findings. 5. Right frontal scalp soft tissue swelling.   Electronically Signed   By: Sherryl Barters M.D.   On: 10/24/2014 11:27   Ct Angio Chest Pe W/cm &/or Wo Cm  10/24/2014   CLINICAL DATA:  80 year old male with acute chest pain with inspiration. Initial encounter.  EXAM: CT ANGIOGRAPHY CHEST WITH CONTRAST  TECHNIQUE: Multidetector CT imaging of the chest was performed using the standard protocol during bolus administration of intravenous contrast. Multiplanar CT image reconstructions and MIPs were obtained to evaluate the vascular anatomy.  CONTRAST:  158mL OMNIPAQUE IOHEXOL 350 MG/ML SOLN  COMPARISON:  Portable chest radiographs 1000 hr the same day.  FINDINGS: Good contrast bolus timing in the pulmonary arterial tree. Respiratory motion artifact in both lower lobes. No convincing focal filling defect identified in the pulmonary arterial tree to suggest the presence of acute pulmonary embolism.  Mediastinal lipomatosis. Negative visualized aorta except for atherosclerosis. Coronary artery atherosclerosis also noted. Cardiomegaly. No pericardial effusion. No mediastinal lymphadenopathy. No pleural effusion.  Major airways are patent with atelectatic changes. Crowding in dependent opacity in both lower lobes most resembles atelectasis. No air bronchograms identified. Similar dependent changes along the right minor fissure and in both upper lobes. No other confluent pulmonary opacity.  Hepatic steatosis.  Negative visualized  spleen.  Visible stomach is distended with fluid and food debris. There is mild to moderate fluid distension of the thoracic esophagus throughout. The gastroesophageal junction appears within normal limits. No esophageal wall thickening or paraesophageal lymphadenopathy.  No acute osseous abnormality identified.  Review of the MIP images confirms the above findings.  IMPRESSION: 1.  No evidence of acute pulmonary embolus. 2. Bilateral pulmonary atelectasis. 3. Fluid distension of the visualized stomach and esophagus, of unclear significance; might indicate gastroesophageal reflux. Followup abdominal radiographs recommended.   Electronically Signed   By: Lars Pinks M.D.   On: 10/24/2014 16:09   Ct Cervical Spine Wo Contrast  10/24/2014   CLINICAL DATA:  Syncope this morning involving a fall with abrasion along the right side of the forehead. Onset this morning of chest pain.  EXAM: CT HEAD WITHOUT CONTRAST  CT CERVICAL SPINE WITHOUT CONTRAST  TECHNIQUE: Multidetector CT imaging of the head and cervical spine was performed following the standard protocol without intravenous contrast. Multiplanar CT image reconstructions of the cervical spine were also generated.  COMPARISON:  None.  FINDINGS: CT HEAD FINDINGS  Focal encephalomalacia in the left frontal  lobe has a chronic appearance on images 14-15 of series 2. Periventricular white matter and corona radiata hypodensities favor chronic ischemic microvascular white matter disease. Otherwise, The brainstem, cerebellum, cerebral peduncles, thalamus, basal ganglia, basilar cisterns, and ventricular system appear within normal limits. No intracranial hemorrhage, mass lesion, or acute CVA. Chronic bilateral maxillary and ethmoid sinusitis. Right frontal scalp soft tissue swelling and infiltrative edema.  CT CERVICAL SPINE FINDINGS  Considerable cervical spondylosis with fused facet joints bilaterally at C2-3 and on the left at C4-5. Extensive degenerative multilevel facet  arthropathy. 2 mm of grade 1 anterolisthesis of C3 on C4 is attributed to the severe facet arthropathy at this level.  Uncinate and facet spurring cause osseous foraminal stenosis on the right at C3-4 and C5-6 ; and on the left at C5-6. Posterior osseous ridging at C4-5, C5-6, and C6-7.  No cervical spine fracture or acute subluxation identified. Bilateral carotid atherosclerotic calcification.  IMPRESSION: 1. No acute intracranial findings. 2. Focal left frontal encephalomalacia appears chronic. 3. Periventricular white matter and corona radiata hypodensities favor chronic ischemic microvascular white matter disease. 4. Cervical spondylosis causing osseous foraminal stenosis at several levels. No acute cervical spine findings. 5. Right frontal scalp soft tissue swelling.   Electronically Signed   By: Sherryl Barters M.D.   On: 10/24/2014 11:27   Dg Chest Port 1 View  10/24/2014   CLINICAL DATA:  Chest pain, tachycardia and syncopal episodes.  EXAM: PORTABLE CHEST - 1 VIEW  COMPARISON:  None.  FINDINGS: Normal heart size. No pleural effusion or edema identified. Atelectasis is identified within the left lung base.  IMPRESSION: 1. Left base atelectasis.   Electronically Signed   By: Kerby Moors M.D.   On: 10/24/2014 10:21      ASSESSMENT/ PLAN:    Atrial fibrillation with rapid ventricular response - Patient now rate controlled (A flutter) with IV diltiazem 10mg /hr.  WIll convert to 60mg  q6hr PO. - CHADS2Vasc score of 6 needs to continue A/C indefinitely (Currently heparin) -Plan for TEE with Cardioversion if still in A fib/ flutter tomorrow. Explained to patient and sister the procedure in detail. -Echo read pending    Chest pain - Serial Troponin negative, echo ordered.  May be secondary to RVR from afib - Ruled out for ACS would not pursue cath at this time.    Syncope   - No Driving for 6 months.  Newly Diagnosed T2DM- diet controlled - Patient meets criteria for DM with elevated  fasting glucose and Hgb A1c of 7.0.  This is currently controlled  - Consider starting low dose metformin PCP follow up.   Leukocytosis - Patient afebrile, no signs of systemic infxn, will check CBC w/ diff in AM.  Nausea/ Abdominal Pain - Greatly improved today. CTA yesterday showed fluid distension in stomach and esophageus of unclear significance.  Will check a KUB today and monitor.  Hyponatremia - Check Urine Na, Cr, Urine Osm  Hepatic Steatosis - Noted on CTA.   Charles Groves, DO  10/25/2014  1:29 PM  I have examined the patient and reviewed assessment and plan and discussed with patient.  Agree with above as stated.  TEE/CV explained to patient.  All questions answered.  He is willing to proceed.  Scheduled for tomorrow.  Switch to oral anticoagulation and rate control. Will start Eliquis 5 mg BID.  Stroke risk of AFib explained to patient.  He is willing to take anticoagulation.  Jettie Booze, MD

## 2014-10-25 NOTE — Progress Notes (Signed)
ANTICOAGULATION CONSULT NOTE - Initial Consult  Pharmacy Consult for Apixaban Indication: atrial fibrillation - non-valvular  Allergies  Allergen Reactions  . Byetta 10 Mcg Pen [Exenatide]   . Prednisone Other (See Comments)    MEMORY ISSUES. HALLUCINATIONS.  Marland Kitchen Corticosteroids Other (See Comments)    HALLUCINATIONS, MEMORY ISSUES    Patient Measurements: Height: 5\' 9"  (175.3 cm) Weight: 188 lb 12.8 oz (85.639 kg) IBW/kg (Calculated) : 70.7  Vital Signs: Temp: 100.2 F (37.9 C) (11/17 1447) Temp Source: Oral (11/17 1447) BP: 128/58 mmHg (11/17 1215) Pulse Rate: 74 (11/17 1215)  Labs:  Recent Labs  10/24/14 0955 10/24/14 2039 10/25/14 0033 10/25/14 0236 10/25/14 0612 10/25/14 1100  HGB 15.3  --   --   --   --   --   HCT 45.5  --   --   --   --   --   PLT 167  --   --   --   --   --   APTT 29  --   --   --   --   --   LABPROT 14.9  --   --   --   --   --   INR 1.16  --   --   --   --   --   HEPARINUNFRC  --   --   --  0.55  --  0.44  CREATININE 1.45*  --   --   --  1.61*  --   TROPONINI  --  <0.30 <0.30  --  <0.30  --     Estimated Creatinine Clearance: 39.7 mL/min (by C-G formula based on Cr of 1.61).   Medical History: Past Medical History  Diagnosis Date  . Dyslipidemia   . Hypertension 01/16/11    ECHOCARDIOGRAM-NORMAL EF>55% STRESS  MYOCARDIAL PERFUSION 06/29/08 NORMAL.  Marland Kitchen Hiatal hernia   . Prostate cancer 2001    rx w/ seed implantation  . Dysrhythmia 10/24/2014    NEW ONSET ATRIAL FIBRILATION  . Type II diabetes mellitus     TYPE 2  . GERD (gastroesophageal reflux disease)   . Cervical spondylosis 10/25/2014    CT C- spine 10/24/14:Cervical spondylosis causing osseous foraminal stenosis at several levels. No acute cervical spine findings.  . Type 2 diabetes mellitus 10/25/2014    Diagnosed 10/25/14     Assessment: 80 YOM with new onset atrial fibrillation to transition from IV heparin to apixaban per pharmacy consult. H/H/plts are stable. No  bleeding is noted. SCr is 1.6 with estimated CrCl ~39.7 mL/min. Wt 85.6 kg.   Goal of Therapy:  Monitor platelets by anticoagulation protocol: Yes   Plan:  Due to age 80 and SCr >1.5, start Apixaban 2.5mg  po BID.  Stop heparin infusion at time of Apixaban start.  Follow-up renal function and need to adjust dosing if needed.   Sloan Leiter, PharmD, BCPS Clinical Pharmacist [phone] 10/25/2014,3:31 PM

## 2014-10-26 ENCOUNTER — Inpatient Hospital Stay (HOSPITAL_COMMUNITY): Payer: Medicare Other | Admitting: Certified Registered Nurse Anesthetist

## 2014-10-26 ENCOUNTER — Encounter (HOSPITAL_COMMUNITY): Payer: Self-pay | Admitting: Physician Assistant

## 2014-10-26 ENCOUNTER — Encounter (HOSPITAL_COMMUNITY)
Admission: EM | Disposition: A | Discharge status: Home / Self Care | Payer: Self-pay | Source: Home / Self Care | Attending: Cardiology

## 2014-10-26 DIAGNOSIS — I4891 Unspecified atrial fibrillation: Secondary | ICD-10-CM

## 2014-10-26 DIAGNOSIS — I10 Essential (primary) hypertension: Secondary | ICD-10-CM

## 2014-10-26 HISTORY — PX: CARDIOVERSION: SHX1299

## 2014-10-26 HISTORY — PX: TEE WITHOUT CARDIOVERSION: SHX5443

## 2014-10-26 LAB — CBC WITH DIFFERENTIAL/PLATELET
BASOS PCT: 0 % (ref 0–1)
Basophils Absolute: 0 10*3/uL (ref 0.0–0.1)
Eosinophils Absolute: 0.1 10*3/uL (ref 0.0–0.7)
Eosinophils Relative: 1 % (ref 0–5)
HCT: 38.6 % — ABNORMAL LOW (ref 39.0–52.0)
HEMOGLOBIN: 12.9 g/dL — AB (ref 13.0–17.0)
Lymphocytes Relative: 15 % (ref 12–46)
Lymphs Abs: 1.2 10*3/uL (ref 0.7–4.0)
MCH: 30.6 pg (ref 26.0–34.0)
MCHC: 33.4 g/dL (ref 30.0–36.0)
MCV: 91.7 fL (ref 78.0–100.0)
MONOS PCT: 10 % (ref 3–12)
Monocytes Absolute: 0.8 10*3/uL (ref 0.1–1.0)
NEUTROS ABS: 6.1 10*3/uL (ref 1.7–7.7)
NEUTROS PCT: 74 % (ref 43–77)
Platelets: 160 10*3/uL (ref 150–400)
RBC: 4.21 MIL/uL — ABNORMAL LOW (ref 4.22–5.81)
RDW: 13.8 % (ref 11.5–15.5)
WBC: 8.2 10*3/uL (ref 4.0–10.5)

## 2014-10-26 LAB — BASIC METABOLIC PANEL
Anion gap: 16 — ABNORMAL HIGH (ref 5–15)
BUN: 18 mg/dL (ref 6–23)
CO2: 19 mEq/L (ref 19–32)
Calcium: 8.7 mg/dL (ref 8.4–10.5)
Chloride: 100 mEq/L (ref 96–112)
Creatinine, Ser: 1.47 mg/dL — ABNORMAL HIGH (ref 0.50–1.35)
GFR calc Af Amer: 50 mL/min — ABNORMAL LOW (ref >90)
GFR calc non Af Amer: 43 mL/min — ABNORMAL LOW (ref >90)
GLUCOSE: 135 mg/dL — AB (ref 70–99)
Potassium: 4.3 mEq/L (ref 3.7–5.3)
Sodium: 135 mEq/L — ABNORMAL LOW (ref 137–147)

## 2014-10-26 LAB — GLUCOSE, CAPILLARY
GLUCOSE-CAPILLARY: 184 mg/dL — AB (ref 70–99)
Glucose-Capillary: 141 mg/dL — ABNORMAL HIGH (ref 70–99)
Glucose-Capillary: 105 mg/dL — ABNORMAL HIGH (ref 70–99)

## 2014-10-26 SURGERY — ECHOCARDIOGRAM, TRANSESOPHAGEAL
Anesthesia: Moderate Sedation

## 2014-10-26 SURGERY — ECHOCARDIOGRAM, TRANSESOPHAGEAL
Anesthesia: Monitor Anesthesia Care

## 2014-10-26 MED ORDER — SODIUM CHLORIDE 0.9 % IV SOLN: INTRAVENOUS | Status: DC

## 2014-10-26 MED ORDER — DILTIAZEM HCL ER COATED BEADS 240 MG PO TB24: 240.0000 mg | ORAL_TABLET | Freq: Every day | ORAL | Status: DC

## 2014-10-26 MED ORDER — LACTATED RINGERS IV SOLN: INTRAVENOUS | Status: DC | PRN

## 2014-10-26 MED ORDER — PROPOFOL 10 MG/ML IV BOLUS
INTRAVENOUS | Status: DC | PRN
  Administered 2014-10-26: 20 mg via INTRAVENOUS
  Administered 2014-10-26 (×3): 30 mg via INTRAVENOUS
  Administered 2014-10-26: 20 mg via INTRAVENOUS
  Administered 2014-10-26 (×3): 10 mg via INTRAVENOUS
  Administered 2014-10-26 (×2): 20 mg via INTRAVENOUS

## 2014-10-26 MED ORDER — BUTAMBEN-TETRACAINE-BENZOCAINE 2-2-14 % EX AERO
INHALATION_SPRAY | CUTANEOUS | Status: DC | PRN
  Administered 2014-10-26: 2 via TOPICAL

## 2014-10-26 MED ORDER — PANTOPRAZOLE SODIUM 40 MG PO TBEC: 40.0000 mg | DELAYED_RELEASE_TABLET | Freq: Two times a day (BID) | ORAL | Status: DC

## 2014-10-26 MED ORDER — APIXABAN 2.5 MG PO TABS: 2.5000 mg | ORAL_TABLET | Freq: Two times a day (BID) | ORAL | Status: DC

## 2014-10-26 MED ORDER — SODIUM CHLORIDE 0.9 % IV SOLN
INTRAVENOUS | Status: DC | PRN
  Administered 2014-10-26: 10:00:00 via INTRAVENOUS

## 2014-10-26 NOTE — Progress Notes (Signed)
SUBJECTIVE:  Still with severe sharp chest pain and cough when he takes a deep breath. His epigastrium feels raw. He generally feels okay. Awaiting TEE/DCCV today at 10am. Worried that the TEE may worsen his hiatal hernia.      OBJECTIVE:   Vitals:   Filed Vitals:   10/25/14 2038 10/26/14 0016 10/26/14 0400 10/26/14 0611  BP: 129/88 96/51 113/67 127/64  Pulse: 73 80 72   Temp: 99.7 F (37.6 C) 99.2 F (37.3 C) 99.3 F (37.4 C)   TempSrc: Oral     Resp:  16 15   Height:      Weight:   187 lb 9.6 oz (85.095 kg)   SpO2: 94% 95% 95%    I&O's:    Intake/Output Summary (Last 24 hours) at 10/26/14 0755 Last data filed at 10/26/14 0617  Gross per 24 hour  Intake    720 ml  Output    925 ml  Net   -205 ml   TELEMETRY: Reviewed telemetry pt in A flutter rate controlled in 70s:    PHYSICAL EXAM General: Well developed, well nourished, in no acute distress Head:   Normal cephalic and atramatic  Lungs:  Clear bilaterally to auscultation. Heart:  Irregular S1 S2  No JVD.   Abdomen: abdomen soft and non-tender Msk:  Back normal,  Normal strength and tone for age. Extremities:  Trace edema.   Neuro: Alert and oriented. Psych:  Normal affect, responds appropriately Skin: No rash   LABS: Basic Metabolic Panel:  Recent Labs  10/24/14 0955 10/25/14 0612  NA 132* 131*  K 4.4 3.7  CL 97 96  CO2 21 21  GLUCOSE 223* 144*  BUN 17 22  CREATININE 1.45* 1.61*  CALCIUM 9.1 8.1*   Liver Function Tests:  Recent Labs  10/24/14 1240 10/25/14 0612  AST 38* 26  ALT 36 29  ALKPHOS 48 41  BILITOT 1.2 1.3*  PROT 7.0 6.3  ALBUMIN 3.4* 2.9*    Recent Labs  10/24/14 0955  LIPASE 33   CBC:  Recent Labs  10/24/14 0955 10/26/14 0505  WBC 13.4* 8.2  NEUTROABS  --  6.1  HGB 15.3 12.9*  HCT 45.5 38.6*  MCV 93.4 91.7  PLT 167 160   Cardiac Enzymes:  Recent Labs  10/24/14 2039 10/25/14 0033 10/25/14 0612  TROPONINI <0.30 <0.30 <0.30    D-Dimer:  Recent  Labs  10/24/14 0955  DDIMER 0.80*   Hemoglobin A1C:  Recent Labs  10/24/14 2039  HGBA1C 7.0*    Thyroid Function Tests:  Recent Labs  10/24/14 1355  TSH 4.130   Coag Panel:   Lab Results  Component Value Date   INR 1.16 10/24/2014    Study Date: 10/25/2014 LV EF: 60% -  65% Study Conclusions - Left ventricle: The cavity size was normal. Systolic function was normal. The estimated ejection fraction was in the range of 60% to 65%. Wall motion was normal; there were no regional wall motion abnormalities. - Aortic valve: Trileaflet; mildly thickened, mildly calcified leaflets. - Mitral valve: There was trivial regurgitation. - Pulmonary arteries: PA peak pressure: 33 mm Hg (S). Impressions: - The right ventricular systolic pressure was increased consistent with mild pulmonary hypertension.    RADIOLOGY: Dg Abd 1 View  10/25/2014   CLINICAL DATA:  Abdominal pain.  EXAM: ABDOMEN - 1 VIEW  COMPARISON:  None.  FINDINGS: The bowel gas pattern is normal. No renal calculi are noted. Status post prostatic brachytherapy seed  placement. Mild amount of stool is noted in the right and transverse colon.  IMPRESSION: No evidence of bowel obstruction or ileus.   Electronically Signed   By: Sabino Dick M.D.   On: 10/25/2014 16:10   Ct Head Wo Contrast  10/24/2014   CLINICAL DATA:  Syncope this morning involving a fall with abrasion along the right side of the forehead. Onset this morning of chest pain.  EXAM: CT HEAD WITHOUT CONTRAST  CT CERVICAL SPINE WITHOUT CONTRAST  TECHNIQUE: Multidetector CT imaging of the head and cervical spine was performed following the standard protocol without intravenous contrast. Multiplanar CT image reconstructions of the cervical spine were also generated.  COMPARISON:  None.  FINDINGS: CT HEAD FINDINGS  Focal encephalomalacia in the left frontal lobe has a chronic appearance on images 14-15 of series 2. Periventricular white matter and corona  radiata hypodensities favor chronic ischemic microvascular white matter disease. Otherwise, The brainstem, cerebellum, cerebral peduncles, thalamus, basal ganglia, basilar cisterns, and ventricular system appear within normal limits. No intracranial hemorrhage, mass lesion, or acute CVA. Chronic bilateral maxillary and ethmoid sinusitis. Right frontal scalp soft tissue swelling and infiltrative edema.  CT CERVICAL SPINE FINDINGS  Considerable cervical spondylosis with fused facet joints bilaterally at C2-3 and on the left at C4-5. Extensive degenerative multilevel facet arthropathy. 2 mm of grade 1 anterolisthesis of C3 on C4 is attributed to the severe facet arthropathy at this level.  Uncinate and facet spurring cause osseous foraminal stenosis on the right at C3-4 and C5-6 ; and on the left at C5-6. Posterior osseous ridging at C4-5, C5-6, and C6-7.  No cervical spine fracture or acute subluxation identified. Bilateral carotid atherosclerotic calcification.  IMPRESSION: 1. No acute intracranial findings. 2. Focal left frontal encephalomalacia appears chronic. 3. Periventricular white matter and corona radiata hypodensities favor chronic ischemic microvascular white matter disease. 4. Cervical spondylosis causing osseous foraminal stenosis at several levels. No acute cervical spine findings. 5. Right frontal scalp soft tissue swelling.   Electronically Signed   By: Sherryl Barters M.D.   On: 10/24/2014 11:27   Ct Angio Chest Pe W/cm &/or Wo Cm  10/24/2014   CLINICAL DATA:  80 year old male with acute chest pain with inspiration. Initial encounter.  EXAM: CT ANGIOGRAPHY CHEST WITH CONTRAST  TECHNIQUE: Multidetector CT imaging of the chest was performed using the standard protocol during bolus administration of intravenous contrast. Multiplanar CT image reconstructions and MIPs were obtained to evaluate the vascular anatomy.  CONTRAST:  123mL OMNIPAQUE IOHEXOL 350 MG/ML SOLN  COMPARISON:  Portable chest  radiographs 1000 hr the same day.  FINDINGS: Good contrast bolus timing in the pulmonary arterial tree. Respiratory motion artifact in both lower lobes. No convincing focal filling defect identified in the pulmonary arterial tree to suggest the presence of acute pulmonary embolism.  Mediastinal lipomatosis. Negative visualized aorta except for atherosclerosis. Coronary artery atherosclerosis also noted. Cardiomegaly. No pericardial effusion. No mediastinal lymphadenopathy. No pleural effusion.  Major airways are patent with atelectatic changes. Crowding in dependent opacity in both lower lobes most resembles atelectasis. No air bronchograms identified. Similar dependent changes along the right minor fissure and in both upper lobes. No other confluent pulmonary opacity.  Hepatic steatosis.  Negative visualized spleen.  Visible stomach is distended with fluid and food debris. There is mild to moderate fluid distension of the thoracic esophagus throughout. The gastroesophageal junction appears within normal limits. No esophageal wall thickening or paraesophageal lymphadenopathy.  No acute osseous abnormality identified.  Review of the MIP  images confirms the above findings.  IMPRESSION: 1.  No evidence of acute pulmonary embolus. 2. Bilateral pulmonary atelectasis. 3. Fluid distension of the visualized stomach and esophagus, of unclear significance; might indicate gastroesophageal reflux. Followup abdominal radiographs recommended.   Electronically Signed   By: Lars Pinks M.D.   On: 10/24/2014 16:09   Ct Cervical Spine Wo Contrast  10/24/2014   CLINICAL DATA:  Syncope this morning involving a fall with abrasion along the right side of the forehead. Onset this morning of chest pain.  EXAM: CT HEAD WITHOUT CONTRAST  CT CERVICAL SPINE WITHOUT CONTRAST  TECHNIQUE: Multidetector CT imaging of the head and cervical spine was performed following the standard protocol without intravenous contrast. Multiplanar CT image  reconstructions of the cervical spine were also generated.  COMPARISON:  None.  FINDINGS: CT HEAD FINDINGS  Focal encephalomalacia in the left frontal lobe has a chronic appearance on images 14-15 of series 2. Periventricular white matter and corona radiata hypodensities favor chronic ischemic microvascular white matter disease. Otherwise, The brainstem, cerebellum, cerebral peduncles, thalamus, basal ganglia, basilar cisterns, and ventricular system appear within normal limits. No intracranial hemorrhage, mass lesion, or acute CVA. Chronic bilateral maxillary and ethmoid sinusitis. Right frontal scalp soft tissue swelling and infiltrative edema.  CT CERVICAL SPINE FINDINGS  Considerable cervical spondylosis with fused facet joints bilaterally at C2-3 and on the left at C4-5. Extensive degenerative multilevel facet arthropathy. 2 mm of grade 1 anterolisthesis of C3 on C4 is attributed to the severe facet arthropathy at this level.  Uncinate and facet spurring cause osseous foraminal stenosis on the right at C3-4 and C5-6 ; and on the left at C5-6. Posterior osseous ridging at C4-5, C5-6, and C6-7.  No cervical spine fracture or acute subluxation identified. Bilateral carotid atherosclerotic calcification.  IMPRESSION: 1. No acute intracranial findings. 2. Focal left frontal encephalomalacia appears chronic. 3. Periventricular white matter and corona radiata hypodensities favor chronic ischemic microvascular white matter disease. 4. Cervical spondylosis causing osseous foraminal stenosis at several levels. No acute cervical spine findings. 5. Right frontal scalp soft tissue swelling.   Electronically Signed   By: Sherryl Barters M.D.   On: 10/24/2014 11:27   Dg Chest Port 1 View  10/24/2014   CLINICAL DATA:  Chest pain, tachycardia and syncopal episodes.  EXAM: PORTABLE CHEST - 1 VIEW  COMPARISON:  None.  FINDINGS: Normal heart size. No pleural effusion or edema identified. Atelectasis is identified within the  left lung base.  IMPRESSION: 1. Left base atelectasis.   Electronically Signed   By: Kerby Moors M.D.   On: 10/24/2014 10:21      ASSESSMENT/ PLAN:   Charles Boyer is a 80 y.o. male with a history of HLD, HTN, hiatal hernia, GERD, and DMT2 who presented to [facility] ED on 10/24/14 with chest pain and syncope and found to be in new onset atrial fibrillation with RVR.    Atrial fibrillation/flutter with rapid ventricular response -- Patient now rate controlled (A flutter) with diltiazem 60mg  q6hr PO. -- CHADS2Vasc score of 6 needs to continue A/C indefinitely. Now on apixaban 2.5mg  BID.  -- Plan for TEE with DCCV at 10am today.  -- Echo 10/25/14 with EF 60-65%, no RWMAs, trivial MR, RV systolic pressure increased c/w mild pulm HTN  Chest pain -- Serial Troponin negative. May be secondary to RVR from afib -- Ruled out for ACS, no further studies indicated   Elevated D-Dimer- CTA neg for PE  Syncope -- No Driving for  6 months.  Newly Diagnosed T2DM- diet controlled -- Patient meets criteria for DM with elevated fasting glucose and Hgb A1c of 7.0.  This is currently controlled  -- Consider starting low dose metformin PCP follow up.   Mild Fever- -- UA neg for infection. CBC with diff this AM unremarkable. CXR with no evidence of PNA  Nausea/ Abdominal Pain -- Still with pain. CTA 10/24/14 showed fluid distension in stomach and esophageus of unclear significance.  -- KUB with no bowel obstruction or ileus -- May consider GI consult at some point   Hyponatremia -- Urine Na 29, Cr 60.89, Urine Osm 299 -- Repeat BMET pending today   Hepatic Steatosis -- Noted on CTA.   Eileen Stanford, PA-C  10/26/2014  7:55 AM    I have examined the patient and reviewed assessment and plan and discussed with patient.  Agree with above as stated.  Changed to oral anticoagulation and oral rate control.  TEE/CV planned today.  He admits to stopping DM meds several months ago.  He just was  tired of taking the medicines.  Will defer to Dr. Jani Gravel as to what he should take for his diabetes.  Possible discharge later today or tomorrow depending on results of cardioversion.   Negative KUB.  No obstruction noted.   Hyponatremia. - will need f/u.  Raylyn Speckman S.

## 2014-10-26 NOTE — Discharge Instructions (Signed)

## 2014-10-26 NOTE — Anesthesia Preprocedure Evaluation (Addendum)
Anesthesia Evaluation  Patient identified by MRN, date of birth, ID band Patient awake    Airway Mallampati: III  TM Distance: >3 FB Neck ROM: Full    Dental  (+) Teeth Intact, Dental Advisory Given, Chipped   Pulmonary          Cardiovascular hypertension, + dysrhythmias Atrial Fibrillation Rhythm:Irregular     Neuro/Psych    GI/Hepatic hiatal hernia, GERD-  Medicated and Controlled,  Endo/Other  diabetes, Type 2  Renal/GU      Musculoskeletal  (+) Arthritis -,   Abdominal   Peds  Hematology   Anesthesia Other Findings   Reproductive/Obstetrics                            Anesthesia Physical Anesthesia Plan  ASA: III  Anesthesia Plan: MAC   Post-op Pain Management:    Induction: Intravenous  Airway Management Planned: Nasal Cannula  Additional Equipment:   Intra-op Plan:   Post-operative Plan:   Informed Consent: I have reviewed the patients History and Physical, chart, labs and discussed the procedure including the risks, benefits and alternatives for the proposed anesthesia with the patient or authorized representative who has indicated his/her understanding and acceptance.   Dental advisory given  Plan Discussed with: Surgeon, Anesthesiologist and CRNA  Anesthesia Plan Comments:        Anesthesia Quick Evaluation

## 2014-10-26 NOTE — CV Procedure (Cosign Needed)
    Transesophageal Echocardiogram Note  BRADON FESTER [id number] [birth year]  Procedure: Transesophageal Echocardiogram Indications: atrial fib / flutter   Procedure Details Consent: Obtained Time Out: Verified patient identification, verified procedure, site/side was marked, verified correct patient position, special equipment/implants available, Radiology Safety Procedures followed,  medications/allergies/relevent history reviewed, required imaging and test results available.  Performed  Medications: Fentanyl:  Versed:  Proporol :  200 mg both TEE and cardioversion  Left Ventrical:  Normal LV function  Mitral Valve: normal MV  Aortic Valve: normal AV  Tricuspid Valve: mild TR  Pulmonic Valve: trivial PI  Left Atrium/ Left atrial appendage: no thrombi,   Atrial septum: + aneurism atrial septum.  Possibly fenestrated vs. Small PFO identified with bubble contrast  Aorta: mild athersclerosis   Complications: No apparent complications Patient did tolerate procedure well.    Cardioversion Note   Procedure: DC Cardioversion Indications: atrial fib  Procedure Details Consent: Obtained Time Out: Verified patient identification, verified procedure, site/side was marked, verified correct patient position, special equipment/implants available, Radiology Safety Procedures followed,  medications/allergies/relevent history reviewed, required imaging and test results available.  Performed  The patient has been on adequate anticoagulation.  The patient received IV propofol  for sedation.  Synchronous cardioversion was performed at 120  joules.  The cardioversion was successful     Complications: No apparent complications Patient did tolerate procedure well.   Thayer Headings, Brooke Bonito., MD, [facility] 10/26/2014, 10:56 AM

## 2014-10-26 NOTE — Progress Notes (Signed)
Pt discharged via W/C accompanied by family, condition stable, Elequis education given, verbalized understanding.

## 2014-10-26 NOTE — Interval H&P Note (Signed)
History and Physical Interval Note:  10/26/2014 10:30 AM  Charles Boyer  has presented today for surgery, with the diagnosis of afib  The various methods of treatment have been discussed with the patient and family. After consideration of risks, benefits and other options for treatment, the patient has consented to  Procedure(s): TRANSESOPHAGEAL ECHOCARDIOGRAM (TEE) (N/A) CARDIOVERSION (N/A) as a surgical intervention .  The patient's history has been reviewed, patient examined, no change in status, stable for surgery.  I have reviewed the patient's chart and labs.  Questions were answered to the patient's satisfaction.     Darden Amber.

## 2014-10-26 NOTE — Discharge Summary (Signed)
Physician Discharge Summary  Patient ID: Charles Boyer MRN: [id number] DOB/AGE: [birth year] 80 y.o.  Primary Cardiologist: Dr. Gwenlyn Found  Admit date: 10/24/2014 Discharge date: 10/26/2014  Admission Diagnoses: Atrial Fibrillation w/ RVR  Discharge Diagnoses:  Principal Problem:   Atrial fibrillation with rapid ventricular response Active Problems:   Leukocytosis   Hyponatremia   Type 2 diabetes mellitus without complications   Hepatic steatosis   Cervical spondylosis   HTN (hypertension)   Discharged Condition: stable  Hospital Course: The patient is an 80 y/o male with a past medical history significant for diabetes, hypertension, hyperlipidemia, and hypothyroidism who presented to [facility] on 10/24/14 for evaluation after a syncopal spell, with a preceding 3 week history of nausea and chest pain. On arrival, he was found to be in new onset atrial fibrillation with RVR.TSH was WNL. He was placed on a Cardizem drip with improvement in heart rates.He was transitioned to PO Cardizem. CT of the head/neck was negative for acute abnormalities. CT of the chest was negative for PE. A 2D echo demonstrated normal LVF w/o WMA. Cardiac enzymes were negative x 3. His CP was felt to be atypical and more consistent with a GI source. He was placed on a PPI and symptoms improved. IV heparin was started for anticoagulation. His CHADS VASC score was calculated to be high (HTN, DM, Age>75). He was placed on Eliquis, 2.5 mg BID. Despite rate control, he continued in atrial fibrillation and the decision was made to attempt TEE cardioversion. The procedure was performed by Dr. Acie Fredrickson. TEE was negative for clot. He underwent successful cardioversion. NSR was restored. He had no post procedural complications. His heart rhythm, rate and BP all remained stable. He was last seen and examined by Dr. Irish Lack, who determined he was stable for discharge home. Post-hospital f/u will be arranged with Dr. Gwenlyn Found or an extender in  3-4 weeks.   Consults: None  Significant Diagnostic Studies:    2D echo 10/25/14 Study Conclusions  - Left ventricle: The cavity size was normal. Systolic function was normal. The estimated ejection fraction was in the range of 60% to 65%. Wall motion was normal; there were no regional wall motion abnormalities. - Aortic valve: Trileaflet; mildly thickened, mildly calcified leaflets. - Mitral valve: There was trivial regurgitation. - Pulmonary arteries: PA peak pressure: 33 mm Hg (S).  Impressions:  - The right ventricular systolic pressure was increased consistent with mild pulmonary hypertension.  Transthoracic echocardiography. M-mode, complete 2D, spectral Doppler, and color Doppler. Birthdate: Patient birthdate: 10/02/1932. Age: Patient is 80 yr old. Sex: Gender: male. BMI: 27.8 kg/m^2. Blood pressure:   102/60 Patient status: Inpatient. Study date: Study date: 10/25/2014. Study time: 01:16 PM. Location: Bedside.   TEE 10/26/14 Left Ventrical: Normal LV function  Mitral Valve: normal MV  Aortic Valve: normal AV  Tricuspid Valve: mild TR  Pulmonic Valve: trivial PI  Left Atrium/ Left atrial appendage: no thrombi,   Atrial septum: + aneurism atrial septum. Possibly fenestrated vs. Small PFO identified with bubble contrast  Aorta: mild athersclerosis   Complications: No apparent complications Patient did tolerate procedure well.    Treatments: See Hospital Course Procedure: DC Cardioversion Indications: atrial fib  Procedure Details Consent: Obtained Time Out: Verified patient identification, verified procedure, site/side was marked, verified correct patient position, special equipment/implants available, Radiology Safety Procedures followed, medications/allergies/relevent history reviewed, required imaging and test results available. Performed  The patient has been on adequate anticoagulation. The patient received IV  propofol for sedation. Synchronous cardioversion was performed  at 120 joules.  The cardioversion was successful   Complications: No apparent complications Patient did tolerate procedure well.   Discharge Exam: Blood pressure 127/67, pulse 70, temperature 97.8 F (36.6 C), temperature source Oral, resp. rate 20, height 5\' 9"  (1.753 m), weight 187 lb 9.6 oz (85.095 kg), SpO2 94 %.   Disposition: Final discharge disposition not confirmed      Discharge Instructions    Diet - low sodium heart healthy    Complete by:  As directed      Increase activity slowly    Complete by:  As directed             Medication List    STOP taking these medications        aspirin 325 MG EC tablet      TAKE these medications        apixaban 2.5 MG Tabs tablet  Commonly known as:  ELIQUIS  Take 1 tablet (2.5 mg total) by mouth 2 (two) times daily.     apixaban 2.5 MG Tabs tablet  Commonly known as:  ELIQUIS  Take 1 tablet (2.5 mg total) by mouth 2 (two) times daily.     diltiazem 240 MG 24 hr tablet  Commonly known as:  CARDIZEM LA  Take 1 tablet (240 mg total) by mouth daily.     l-methylfolate-B6-B12 3-35-2 MG Tabs  Commonly known as:  METANX  Take 1 tablet by mouth 2 (two) times daily.     LEVOTHYROXINE SODIUM PO  Take 0.5 tablets by mouth once a week. On Sunday.  Samples from md     pantoprazole 40 MG tablet  Commonly known as:  PROTONIX  Take 1 tablet (40 mg total) by mouth 2 (two) times daily.       Follow-up Information    Follow up with Lorretta Harp, MD.   Specialty:  Cardiology   Why:  our office will call you with an appointment   Contact information:   [address] [place] [address] [phone]       [place], [place]: >30 MINUTES   Signed: Lyda Jester 10/26/2014, 5:29 PM  I have examined the patient and reviewed assessment and plan and discussed with patient.  Agree with above as  stated.  Patient with sucessful cardioversion. Anticoagulation with Eliquis , dosed for age and Cr.  Long acting dilt.  VARANASI,JAYADEEP S.

## 2014-10-26 NOTE — Transfer of Care (Signed)
Immediate Anesthesia Transfer of Care Note  Patient: Charles Boyer  Procedure(s) Performed: Procedure(s): TRANSESOPHAGEAL ECHOCARDIOGRAM (TEE) (N/A) CARDIOVERSION (N/A)  Patient Location: Endoscopy Unit  Anesthesia Type:MAC  Level of Consciousness: awake, alert  and oriented  Airway & Oxygen Therapy: Patient Spontanous Breathing and Patient connected to nasal cannula oxygen  Post-op Assessment: Report given to PACU RN and Post -op Vital signs reviewed and stable  Post vital signs: Reviewed and stable  Complications: No apparent anesthesia complications

## 2014-10-26 NOTE — Progress Notes (Signed)
  Echocardiogram Echocardiogram Transesophageal has been performed.  Donata Clay 10/26/2014, 10:54 AM

## 2014-10-26 NOTE — H&P (View-Only) (Signed)
SUBJECTIVE:  Patient reports his chest pain and nausea are much improved today.  He reports he feels this is due to his HR being under better control as he noticed these resolved as his HR came down.  OBJECTIVE:   Vitals:   Filed Vitals:   10/24/14 2340 10/25/14 0400 10/25/14 0803 10/25/14 1215  BP: 121/55 111/64 102/60 128/58  Pulse: 72 72 72 74  Temp: 97.6 F (36.4 C) 98 F (36.7 C) 98.3 F (36.8 C) 99.1 F (37.3 C)  TempSrc:   Oral Oral  Resp: 16 20 19 23   Height:      Weight:  188 lb 12.8 oz (85.639 kg)    SpO2: 94% 92% 91% 91%   I&O's:   Intake/Output Summary (Last 24 hours) at 10/25/14 1329 Last data filed at 10/25/14 1300  Gross per 24 hour  Intake   1092 ml  Output    650 ml  Net    442 ml   TELEMETRY: Reviewed telemetry pt in A flutter rate controlled in 70s:     PHYSICAL EXAM General: Well developed, well nourished, in no acute distress Head:   Normal cephalic and atramatic  Lungs:  Clear bilaterally to auscultation. Heart:  Irregular S1 S2  No JVD.   Abdomen: abdomen soft and non-tender Msk:  Back normal,  Normal strength and tone for age. Extremities:  Trace edema.   Neuro: Alert and oriented. Psych:  Normal affect, responds appropriately Skin: No rash   LABS: Basic Metabolic Panel:  Recent Labs  10/24/14 0955 10/25/14 0612  NA 132* 131*  K 4.4 3.7  CL 97 96  CO2 21 21  GLUCOSE 223* 144*  BUN 17 22  CREATININE 1.45* 1.61*  CALCIUM 9.1 8.1*   Liver Function Tests:  Recent Labs  10/24/14 1240 10/25/14 0612  AST 38* 26  ALT 36 29  ALKPHOS 48 41  BILITOT 1.2 1.3*  PROT 7.0 6.3  ALBUMIN 3.4* 2.9*    Recent Labs  10/24/14 0955  LIPASE 33   CBC:  Recent Labs  10/24/14 0955  WBC 13.4*  HGB 15.3  HCT 45.5  MCV 93.4  PLT 167   Cardiac Enzymes:  Recent Labs  10/24/14 2039 10/25/14 0033 10/25/14 0612  TROPONINI <0.30 <0.30 <0.30   BNP: Invalid input(s): POCBNP D-Dimer:  Recent Labs  10/24/14 0955  DDIMER  0.80*   Hemoglobin A1C:  Recent Labs  10/24/14 2039  HGBA1C 7.0*   Fasting Lipid Panel: No results for input(s): CHOL, HDL, LDLCALC, TRIG, CHOLHDL, LDLDIRECT in the last 72 hours. Thyroid Function Tests:  Recent Labs  10/24/14 1355  TSH 4.130   Anemia Panel: No results for input(s): VITAMINB12, FOLATE, FERRITIN, TIBC, IRON, RETICCTPCT in the last 72 hours. Coag Panel:   Lab Results  Component Value Date   INR 1.16 10/24/2014    RADIOLOGY: Ct Head Wo Contrast  10/24/2014   CLINICAL DATA:  Syncope this morning involving a fall with abrasion along the right side of the forehead. Onset this morning of chest pain.  EXAM: CT HEAD WITHOUT CONTRAST  CT CERVICAL SPINE WITHOUT CONTRAST  TECHNIQUE: Multidetector CT imaging of the head and cervical spine was performed following the standard protocol without intravenous contrast. Multiplanar CT image reconstructions of the cervical spine were also generated.  COMPARISON:  None.  FINDINGS: CT HEAD FINDINGS  Focal encephalomalacia in the left frontal lobe has a chronic appearance on images 14-15 of series 2. Periventricular white matter and corona radiata hypodensities  favor chronic ischemic microvascular white matter disease. Otherwise, The brainstem, cerebellum, cerebral peduncles, thalamus, basal ganglia, basilar cisterns, and ventricular system appear within normal limits. No intracranial hemorrhage, mass lesion, or acute CVA. Chronic bilateral maxillary and ethmoid sinusitis. Right frontal scalp soft tissue swelling and infiltrative edema.  CT CERVICAL SPINE FINDINGS  Considerable cervical spondylosis with fused facet joints bilaterally at C2-3 and on the left at C4-5. Extensive degenerative multilevel facet arthropathy. 2 mm of grade 1 anterolisthesis of C3 on C4 is attributed to the severe facet arthropathy at this level.  Uncinate and facet spurring cause osseous foraminal stenosis on the right at C3-4 and C5-6 ; and on the left at C5-6.  Posterior osseous ridging at C4-5, C5-6, and C6-7.  No cervical spine fracture or acute subluxation identified. Bilateral carotid atherosclerotic calcification.  IMPRESSION: 1. No acute intracranial findings. 2. Focal left frontal encephalomalacia appears chronic. 3. Periventricular white matter and corona radiata hypodensities favor chronic ischemic microvascular white matter disease. 4. Cervical spondylosis causing osseous foraminal stenosis at several levels. No acute cervical spine findings. 5. Right frontal scalp soft tissue swelling.   Electronically Signed   By: Sherryl Barters M.D.   On: 10/24/2014 11:27   Ct Angio Chest Pe W/cm &/or Wo Cm  10/24/2014   CLINICAL DATA:  80 year old male with acute chest pain with inspiration. Initial encounter.  EXAM: CT ANGIOGRAPHY CHEST WITH CONTRAST  TECHNIQUE: Multidetector CT imaging of the chest was performed using the standard protocol during bolus administration of intravenous contrast. Multiplanar CT image reconstructions and MIPs were obtained to evaluate the vascular anatomy.  CONTRAST:  149mL OMNIPAQUE IOHEXOL 350 MG/ML SOLN  COMPARISON:  Portable chest radiographs 1000 hr the same day.  FINDINGS: Good contrast bolus timing in the pulmonary arterial tree. Respiratory motion artifact in both lower lobes. No convincing focal filling defect identified in the pulmonary arterial tree to suggest the presence of acute pulmonary embolism.  Mediastinal lipomatosis. Negative visualized aorta except for atherosclerosis. Coronary artery atherosclerosis also noted. Cardiomegaly. No pericardial effusion. No mediastinal lymphadenopathy. No pleural effusion.  Major airways are patent with atelectatic changes. Crowding in dependent opacity in both lower lobes most resembles atelectasis. No air bronchograms identified. Similar dependent changes along the right minor fissure and in both upper lobes. No other confluent pulmonary opacity.  Hepatic steatosis.  Negative visualized  spleen.  Visible stomach is distended with fluid and food debris. There is mild to moderate fluid distension of the thoracic esophagus throughout. The gastroesophageal junction appears within normal limits. No esophageal wall thickening or paraesophageal lymphadenopathy.  No acute osseous abnormality identified.  Review of the MIP images confirms the above findings.  IMPRESSION: 1.  No evidence of acute pulmonary embolus. 2. Bilateral pulmonary atelectasis. 3. Fluid distension of the visualized stomach and esophagus, of unclear significance; might indicate gastroesophageal reflux. Followup abdominal radiographs recommended.   Electronically Signed   By: Lars Pinks M.D.   On: 10/24/2014 16:09   Ct Cervical Spine Wo Contrast  10/24/2014   CLINICAL DATA:  Syncope this morning involving a fall with abrasion along the right side of the forehead. Onset this morning of chest pain.  EXAM: CT HEAD WITHOUT CONTRAST  CT CERVICAL SPINE WITHOUT CONTRAST  TECHNIQUE: Multidetector CT imaging of the head and cervical spine was performed following the standard protocol without intravenous contrast. Multiplanar CT image reconstructions of the cervical spine were also generated.  COMPARISON:  None.  FINDINGS: CT HEAD FINDINGS  Focal encephalomalacia in the left frontal  lobe has a chronic appearance on images 14-15 of series 2. Periventricular white matter and corona radiata hypodensities favor chronic ischemic microvascular white matter disease. Otherwise, The brainstem, cerebellum, cerebral peduncles, thalamus, basal ganglia, basilar cisterns, and ventricular system appear within normal limits. No intracranial hemorrhage, mass lesion, or acute CVA. Chronic bilateral maxillary and ethmoid sinusitis. Right frontal scalp soft tissue swelling and infiltrative edema.  CT CERVICAL SPINE FINDINGS  Considerable cervical spondylosis with fused facet joints bilaterally at C2-3 and on the left at C4-5. Extensive degenerative multilevel facet  arthropathy. 2 mm of grade 1 anterolisthesis of C3 on C4 is attributed to the severe facet arthropathy at this level.  Uncinate and facet spurring cause osseous foraminal stenosis on the right at C3-4 and C5-6 ; and on the left at C5-6. Posterior osseous ridging at C4-5, C5-6, and C6-7.  No cervical spine fracture or acute subluxation identified. Bilateral carotid atherosclerotic calcification.  IMPRESSION: 1. No acute intracranial findings. 2. Focal left frontal encephalomalacia appears chronic. 3. Periventricular white matter and corona radiata hypodensities favor chronic ischemic microvascular white matter disease. 4. Cervical spondylosis causing osseous foraminal stenosis at several levels. No acute cervical spine findings. 5. Right frontal scalp soft tissue swelling.   Electronically Signed   By: Sherryl Barters M.D.   On: 10/24/2014 11:27   Dg Chest Port 1 View  10/24/2014   CLINICAL DATA:  Chest pain, tachycardia and syncopal episodes.  EXAM: PORTABLE CHEST - 1 VIEW  COMPARISON:  None.  FINDINGS: Normal heart size. No pleural effusion or edema identified. Atelectasis is identified within the left lung base.  IMPRESSION: 1. Left base atelectasis.   Electronically Signed   By: Kerby Moors M.D.   On: 10/24/2014 10:21      ASSESSMENT/ PLAN:    Atrial fibrillation with rapid ventricular response - Patient now rate controlled (A flutter) with IV diltiazem 10mg /hr.  WIll convert to 60mg  q6hr PO. - CHADS2Vasc score of 6 needs to continue A/C indefinitely (Currently heparin) -Plan for TEE with Cardioversion if still in A fib/ flutter tomorrow. Explained to patient and sister the procedure in detail. -Echo read pending    Chest pain - Serial Troponin negative, echo ordered.  May be secondary to RVR from afib - Ruled out for ACS would not pursue cath at this time.    Syncope   - No Driving for 6 months.  Newly Diagnosed T2DM- diet controlled - Patient meets criteria for DM with elevated  fasting glucose and Hgb A1c of 7.0.  This is currently controlled  - Consider starting low dose metformin PCP follow up.   Leukocytosis - Patient afebrile, no signs of systemic infxn, will check CBC w/ diff in AM.  Nausea/ Abdominal Pain - Greatly improved today. CTA yesterday showed fluid distension in stomach and esophageus of unclear significance.  Will check a KUB today and monitor.  Hyponatremia - Check Urine Na, Cr, Urine Osm  Hepatic Steatosis - Noted on CTA.   Lucious Groves, DO  10/25/2014  1:29 PM  I have examined the patient and reviewed assessment and plan and discussed with patient.  Agree with above as stated.  TEE/CV explained to patient.  All questions answered.  He is willing to proceed.  Scheduled for tomorrow.  Switch to oral anticoagulation and rate control. Will start Eliquis 5 mg BID.  Stroke risk of AFib explained to patient.  He is willing to take anticoagulation.  Jettie Booze, MD

## 2014-10-27 ENCOUNTER — Encounter (HOSPITAL_COMMUNITY): Payer: Self-pay | Admitting: Cardiovascular Disease

## 2014-10-27 NOTE — Anesthesia Postprocedure Evaluation (Signed)
  Anesthesia Post-op Note  Patient: Charles Boyer  Procedure(s) Performed: Procedure(s): TRANSESOPHAGEAL ECHOCARDIOGRAM (TEE) (N/A) CARDIOVERSION (N/A)  Patient Location: PACU  Anesthesia Type:General  Level of Consciousness: awake, alert , oriented and patient cooperative  Airway and Oxygen Therapy: Patient Spontanous Breathing  Post-op Pain: none  Post-op Assessment: Post-op Vital signs reviewed, Patient's Cardiovascular Status Stable, Respiratory Function Stable, Patent Airway, No signs of Nausea or vomiting and Pain level controlled  Post-op Vital Signs: stable  Last Vitals:  Filed Vitals:   10/26/14 1643  BP: 127/67  Pulse: 70  Temp: 36.6 C  Resp: 20    Complications: No apparent anesthesia complications

## 2014-10-31 ENCOUNTER — Telehealth: Payer: Self-pay | Admitting: Cardiovascular Disease

## 2014-11-01 NOTE — Telephone Encounter (Signed)
Closed encounter °

## 2014-11-02 ENCOUNTER — Telehealth: Payer: Self-pay | Admitting: *Deleted

## 2014-11-02 NOTE — Telephone Encounter (Signed)
P.A. For Eliquis completed by [facility] hager-PA in hospital. Approval letter received 11/02/14

## 2014-11-24 ENCOUNTER — Ambulatory Visit: Payer: Medicare Other | Admitting: Cardiology

## 2014-12-15 ENCOUNTER — Encounter: Payer: Self-pay | Admitting: Cardiovascular Disease

## 2014-12-15 ENCOUNTER — Ambulatory Visit (INDEPENDENT_AMBULATORY_CARE_PROVIDER_SITE_OTHER): Payer: Medicare Other | Admitting: Cardiovascular Disease

## 2014-12-15 VITALS — BP 154/73 | HR 64 | Ht 69.0 in | Wt 190.0 lb

## 2014-12-15 DIAGNOSIS — I4891 Unspecified atrial fibrillation: Secondary | ICD-10-CM | POA: Diagnosis not present

## 2014-12-15 DIAGNOSIS — I10 Essential (primary) hypertension: Secondary | ICD-10-CM | POA: Diagnosis not present

## 2014-12-15 DIAGNOSIS — E785 Hyperlipidemia, unspecified: Secondary | ICD-10-CM | POA: Diagnosis not present

## 2014-12-15 DIAGNOSIS — M10361 Gout due to renal impairment, right knee: Secondary | ICD-10-CM | POA: Diagnosis not present

## 2014-12-15 NOTE — Assessment & Plan Note (Signed)
History of hyperlipidemia on pravastatin followed by his PCP 

## 2014-12-15 NOTE — Patient Instructions (Signed)
Your physician wants you to follow-up in: 1 year with Dr Berry. You will receive a reminder letter in the mail two months in advance. If you don't receive a letter, please call our office to schedule the follow-up appointment.  

## 2014-12-15 NOTE — Assessment & Plan Note (Signed)
History of hypertension with blood pressure measured today at 154/73. He is on diltiazem. Continue current meds at current dosing

## 2014-12-15 NOTE — Assessment & Plan Note (Signed)
History of PAF with recent admission with syncope, PAF and chest pain. His enzymes were negative. He underwent TEE guided DC cardioversion by Dr. Tarri Glenn successfully to sinus rhythm with one shock. This was done on 10/26/14. He was sent home on a Eliquis oral at the coagulation as well as diltiazem. He remains in sinus rhythm.

## 2014-12-15 NOTE — Progress Notes (Signed)
12/15/2014 Charles Boyer   31-Aug-1932  [id number]  Primary Physician Jani Gravel, MD Primary Cardiologist: Lorretta Harp MD Renae Gloss   HPI:  Charles Boyer is a 81 year old mild to moderately overweight married Caucasian male father of 2, grandfather 2 grandchildren who I last saw in the office 08/07/12 his primary care physician is Dr. Jani Gravel. 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 He was admitted to [facility] 10/24/14 with syncope, A. Fib with RVR and chest pain. He ruled out for myocardial infarction. A 2-D echo was normal. He underwent TEE guided DC cardioversion by Dr. Cathie Olden 10/26/14 successfully to sinus rhythm. He was discharged home on Eliquis oral anticoagulation. He remains in normal sinus rhythm. His last Myoview performed by me in 2009 was normal.   Current Outpatient Prescriptions  Medication Sig Dispense Refill  . apixaban (ELIQUIS) 2.5 MG TABS tablet Take 1 tablet (2.5 mg total) by mouth 2 (two) times daily. 60 tablet 11  . apixaban (ELIQUIS) 2.5 MG TABS tablet Take 1 tablet (2.5 mg total) by mouth 2 (two) times daily. 60 tablet 0  . colchicine 0.6 MG tablet Take 0.6 mg by mouth as needed.   0  . diltiazem (CARDIZEM LA) 240 MG 24 hr tablet Take 1 tablet (240 mg total) by mouth daily. 30 tablet 5  . l-methylfolate-B6-B12 (METANX) 3-35-2 MG TABS Take 1 tablet by mouth 2 (two) times daily.    Marland Kitchen LEVOTHYROXINE SODIUM PO Take 0.5 tablets by mouth once a week. On Sunday.  Samples from md    . pantoprazole (PROTONIX) 40 MG tablet Take 1 tablet (40 mg total) by mouth 2 (two) times daily. 30 tablet 5  . pioglitazone (ACTOS) 30 MG tablet Take 30 mg by mouth daily.  13  . pravastatin (PRAVACHOL) 10 MG tablet Take 10 mg by mouth daily.   13   No current facility-administered medications for this visit.    Allergies  Allergen Reactions  . Byetta 10 Mcg Pen [Exenatide]   . Prednisone Other (See Comments)    MEMORY  ISSUES. HALLUCINATIONS.  Marland Kitchen Corticosteroids Other (See Comments)    HALLUCINATIONS, MEMORY ISSUES    History   Social History  . Marital Status: Married    Spouse Name: N/A    Number of Children: N/A  . Years of Education: N/A   Occupational History  . 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 Smoking status: Never Smoker   . Smokeless tobacco: Never Used  . Alcohol Use: 0.0 oz/week    0 Not specified per week     Comment: 1-2 drinks/week  . Drug Use: No  . Sexual Activity: Not on file   Other Topics Concern  . Not on file   Social History Narrative   Lives with wife     Review of Systems: General: negative for chills, fever, night sweats or weight changes.  Cardiovascular: negative for chest pain, dyspnea on exertion, edema, orthopnea, palpitations, paroxysmal nocturnal dyspnea or shortness of breath Dermatological: negative for rash Respiratory: negative for cough or wheezing Urologic: negative for hematuria Abdominal: negative for nausea, vomiting, diarrhea, bright red blood per rectum, melena, or hematemesis Neurologic: negative for visual changes, syncope, or dizziness All other systems reviewed and are otherwise negative except as noted above.    Blood pressure 154/73, pulse 64, height 5' 9" (1.753 m), weight 190 lb (86.183 kg).  General  appearance: alert and no distress Neck: no adenopathy, no carotid bruit, no JVD, supple, symmetrical, trachea midline and thyroid not enlarged, symmetric, no tenderness/mass/nodules Lungs: clear to auscultation bilaterally Heart: regular rate and rhythm, S1, S2 normal, no murmur, click, rub or gallop Extremities: extremities normal, atraumatic, no cyanosis or edema  EKG normal sinus rhythm at 63 with a ST or T-wave changes. I personally reviewed this EKG  ASSESSMENT AND PLAN:   HTN (hypertension) History of hypertension with blood pressure measured today at 154/73. He is on diltiazem. Continue current meds at current  dosing  Hyperlipidemia History of hyperlipidemia on pravastatin followed by his PCP  Atrial fibrillation with rapid ventricular response History of PAF with recent admission with syncope, PAF and chest pain. His enzymes were negative. He underwent TEE guided DC cardioversion by Dr. Tarri Glenn successfully to sinus rhythm with one shock. This was done on 10/26/14. He was sent home on a Eliquis oral at the coagulation as well as diltiazem. He remains in sinus rhythm.      Lorretta Harp MD FACP,FACC,FAHA, [facility] 12/15/2014 4:19 PM

## 2014-12-23 ENCOUNTER — Ambulatory Visit: Payer: Medicare Other | Admitting: Cardiovascular Disease

## 2015-01-02 DIAGNOSIS — N400 Enlarged prostate without lower urinary tract symptoms: Secondary | ICD-10-CM | POA: Diagnosis not present

## 2015-01-02 DIAGNOSIS — E118 Type 2 diabetes mellitus with unspecified complications: Secondary | ICD-10-CM | POA: Diagnosis not present

## 2015-01-02 DIAGNOSIS — E039 Hypothyroidism, unspecified: Secondary | ICD-10-CM | POA: Diagnosis not present

## 2015-01-02 DIAGNOSIS — I10 Essential (primary) hypertension: Secondary | ICD-10-CM | POA: Diagnosis not present

## 2015-01-05 ENCOUNTER — Encounter: Payer: Self-pay | Admitting: Cardiovascular Disease

## 2015-01-05 DIAGNOSIS — I10 Essential (primary) hypertension: Secondary | ICD-10-CM | POA: Diagnosis not present

## 2015-01-05 DIAGNOSIS — E118 Type 2 diabetes mellitus with unspecified complications: Secondary | ICD-10-CM | POA: Diagnosis not present

## 2015-01-05 DIAGNOSIS — N400 Enlarged prostate without lower urinary tract symptoms: Secondary | ICD-10-CM | POA: Diagnosis not present

## 2015-01-05 DIAGNOSIS — E039 Hypothyroidism, unspecified: Secondary | ICD-10-CM | POA: Diagnosis not present

## 2015-01-06 ENCOUNTER — Telehealth: Payer: Self-pay | Admitting: *Deleted

## 2015-01-06 DIAGNOSIS — Z79899 Other long term (current) drug therapy: Secondary | ICD-10-CM

## 2015-01-06 DIAGNOSIS — E785 Hyperlipidemia, unspecified: Secondary | ICD-10-CM

## 2015-01-06 MED ORDER — PRAVASTATIN SODIUM 20 MG PO TABS: 20.0000 mg | ORAL_TABLET | Freq: Every day | ORAL | Status: DC

## 2015-01-06 NOTE — Telephone Encounter (Signed)
-----   Message from Lorretta Harp, MD sent at 01/06/2015  7:02 AM EST ----- Increase Prav to 20 and re check

## 2015-01-06 NOTE — Telephone Encounter (Signed)
Patient notified of results [place] sent in and lab slip mailed for repeat labs.

## 2015-01-11 ENCOUNTER — Encounter: Payer: Self-pay | Admitting: Cardiovascular Disease

## 2015-01-20 ENCOUNTER — Other Ambulatory Visit: Payer: Self-pay | Admitting: Cardiology

## 2015-01-20 NOTE — Telephone Encounter (Signed)
Rx(s) sent to pharmacy electronically.  

## 2015-02-07 ENCOUNTER — Other Ambulatory Visit: Payer: Self-pay | Admitting: Cardiology

## 2015-02-08 NOTE — Telephone Encounter (Signed)
Refill refused - refilled #[id number] with 11 refills on 01/20/15

## 2015-02-17 DIAGNOSIS — E039 Hypothyroidism, unspecified: Secondary | ICD-10-CM | POA: Diagnosis not present

## 2015-02-17 DIAGNOSIS — I10 Essential (primary) hypertension: Secondary | ICD-10-CM | POA: Diagnosis not present

## 2015-02-17 DIAGNOSIS — M109 Gout, unspecified: Secondary | ICD-10-CM | POA: Diagnosis not present

## 2015-02-17 DIAGNOSIS — E119 Type 2 diabetes mellitus without complications: Secondary | ICD-10-CM | POA: Diagnosis not present

## 2015-03-14 DIAGNOSIS — E039 Hypothyroidism, unspecified: Secondary | ICD-10-CM | POA: Diagnosis not present

## 2015-03-14 DIAGNOSIS — I10 Essential (primary) hypertension: Secondary | ICD-10-CM | POA: Diagnosis not present

## 2015-03-14 DIAGNOSIS — E118 Type 2 diabetes mellitus with unspecified complications: Secondary | ICD-10-CM | POA: Diagnosis not present

## 2015-03-16 DIAGNOSIS — E039 Hypothyroidism, unspecified: Secondary | ICD-10-CM | POA: Diagnosis not present

## 2015-03-16 DIAGNOSIS — I10 Essential (primary) hypertension: Secondary | ICD-10-CM | POA: Diagnosis not present

## 2015-03-16 DIAGNOSIS — E119 Type 2 diabetes mellitus without complications: Secondary | ICD-10-CM | POA: Diagnosis not present

## 2015-04-28 ENCOUNTER — Other Ambulatory Visit: Payer: Self-pay | Admitting: Cardiology

## 2015-04-28 NOTE — Telephone Encounter (Signed)
Rx(s) sent to pharmacy electronically.  

## 2015-05-03 ENCOUNTER — Other Ambulatory Visit: Payer: Self-pay | Admitting: Cardiology

## 2015-05-04 NOTE — Telephone Encounter (Signed)
Rx(s) sent to pharmacy electronically.  

## 2015-06-15 DIAGNOSIS — E119 Type 2 diabetes mellitus without complications: Secondary | ICD-10-CM | POA: Diagnosis not present

## 2015-06-15 DIAGNOSIS — E039 Hypothyroidism, unspecified: Secondary | ICD-10-CM | POA: Diagnosis not present

## 2015-06-15 DIAGNOSIS — I10 Essential (primary) hypertension: Secondary | ICD-10-CM | POA: Diagnosis not present

## 2015-06-20 ENCOUNTER — Encounter: Payer: Self-pay | Admitting: Cardiovascular Disease

## 2015-06-20 DIAGNOSIS — I10 Essential (primary) hypertension: Secondary | ICD-10-CM | POA: Diagnosis not present

## 2015-06-20 DIAGNOSIS — N183 Chronic kidney disease, stage 3 (moderate): Secondary | ICD-10-CM | POA: Diagnosis not present

## 2015-06-20 DIAGNOSIS — I4891 Unspecified atrial fibrillation: Secondary | ICD-10-CM | POA: Diagnosis not present

## 2015-06-20 DIAGNOSIS — M10361 Gout due to renal impairment, right knee: Secondary | ICD-10-CM | POA: Diagnosis not present

## 2015-06-22 ENCOUNTER — Encounter: Payer: Self-pay | Admitting: *Deleted

## 2015-07-13 DIAGNOSIS — H2513 Age-related nuclear cataract, bilateral: Secondary | ICD-10-CM | POA: Diagnosis not present

## 2015-07-13 DIAGNOSIS — H524 Presbyopia: Secondary | ICD-10-CM | POA: Diagnosis not present

## 2015-07-18 DIAGNOSIS — H2513 Age-related nuclear cataract, bilateral: Secondary | ICD-10-CM | POA: Diagnosis not present

## 2015-07-18 DIAGNOSIS — H3531 Nonexudative age-related macular degeneration: Secondary | ICD-10-CM | POA: Diagnosis not present

## 2015-08-24 DIAGNOSIS — H18411 Arcus senilis, right eye: Secondary | ICD-10-CM | POA: Diagnosis not present

## 2015-08-24 DIAGNOSIS — H18412 Arcus senilis, left eye: Secondary | ICD-10-CM | POA: Diagnosis not present

## 2015-08-24 DIAGNOSIS — H2511 Age-related nuclear cataract, right eye: Secondary | ICD-10-CM | POA: Diagnosis not present

## 2015-08-24 DIAGNOSIS — H3531 Nonexudative age-related macular degeneration: Secondary | ICD-10-CM | POA: Diagnosis not present

## 2015-08-24 DIAGNOSIS — H2512 Age-related nuclear cataract, left eye: Secondary | ICD-10-CM | POA: Diagnosis not present

## 2015-09-11 DIAGNOSIS — H2511 Age-related nuclear cataract, right eye: Secondary | ICD-10-CM | POA: Diagnosis not present

## 2015-09-11 DIAGNOSIS — H25811 Combined forms of age-related cataract, right eye: Secondary | ICD-10-CM | POA: Diagnosis not present

## 2015-09-12 DIAGNOSIS — H25012 Cortical age-related cataract, left eye: Secondary | ICD-10-CM | POA: Diagnosis not present

## 2015-09-12 DIAGNOSIS — H2512 Age-related nuclear cataract, left eye: Secondary | ICD-10-CM | POA: Diagnosis not present

## 2015-09-19 DIAGNOSIS — I10 Essential (primary) hypertension: Secondary | ICD-10-CM | POA: Diagnosis not present

## 2015-09-19 DIAGNOSIS — E119 Type 2 diabetes mellitus without complications: Secondary | ICD-10-CM | POA: Diagnosis not present

## 2015-09-19 DIAGNOSIS — M10361 Gout due to renal impairment, right knee: Secondary | ICD-10-CM | POA: Diagnosis not present

## 2015-09-19 DIAGNOSIS — E039 Hypothyroidism, unspecified: Secondary | ICD-10-CM | POA: Diagnosis not present

## 2015-09-26 DIAGNOSIS — E7800 Pure hypercholesterolemia, unspecified: Secondary | ICD-10-CM | POA: Diagnosis not present

## 2015-09-26 DIAGNOSIS — E039 Hypothyroidism, unspecified: Secondary | ICD-10-CM | POA: Diagnosis not present

## 2015-09-26 DIAGNOSIS — I10 Essential (primary) hypertension: Secondary | ICD-10-CM | POA: Diagnosis not present

## 2015-09-26 DIAGNOSIS — E119 Type 2 diabetes mellitus without complications: Secondary | ICD-10-CM | POA: Diagnosis not present

## 2015-10-02 DIAGNOSIS — H2512 Age-related nuclear cataract, left eye: Secondary | ICD-10-CM | POA: Diagnosis not present

## 2015-10-02 DIAGNOSIS — H25812 Combined forms of age-related cataract, left eye: Secondary | ICD-10-CM | POA: Diagnosis not present

## 2015-11-01 ENCOUNTER — Other Ambulatory Visit: Payer: Self-pay | Admitting: Cardiovascular Disease

## 2015-11-01 ENCOUNTER — Other Ambulatory Visit: Payer: Self-pay | Admitting: Cardiology

## 2015-11-01 NOTE — Telephone Encounter (Signed)
Rx request sent to pharmacy.  

## 2015-11-09 DIAGNOSIS — H1031 Unspecified acute conjunctivitis, right eye: Secondary | ICD-10-CM | POA: Diagnosis not present

## 2015-12-15 ENCOUNTER — Ambulatory Visit: Payer: Self-pay | Admitting: Cardiovascular Disease

## 2016-01-15 DIAGNOSIS — E119 Type 2 diabetes mellitus without complications: Secondary | ICD-10-CM | POA: Diagnosis not present

## 2016-01-15 DIAGNOSIS — Z125 Encounter for screening for malignant neoplasm of prostate: Secondary | ICD-10-CM | POA: Diagnosis not present

## 2016-01-15 DIAGNOSIS — M109 Gout, unspecified: Secondary | ICD-10-CM | POA: Diagnosis not present

## 2016-01-15 DIAGNOSIS — I10 Essential (primary) hypertension: Secondary | ICD-10-CM | POA: Diagnosis not present

## 2016-01-15 DIAGNOSIS — E039 Hypothyroidism, unspecified: Secondary | ICD-10-CM | POA: Diagnosis not present

## 2016-01-18 DIAGNOSIS — E7800 Pure hypercholesterolemia, unspecified: Secondary | ICD-10-CM | POA: Diagnosis not present

## 2016-01-18 DIAGNOSIS — E119 Type 2 diabetes mellitus without complications: Secondary | ICD-10-CM | POA: Diagnosis not present

## 2016-01-18 DIAGNOSIS — I10 Essential (primary) hypertension: Secondary | ICD-10-CM | POA: Diagnosis not present

## 2016-01-18 DIAGNOSIS — E039 Hypothyroidism, unspecified: Secondary | ICD-10-CM | POA: Diagnosis not present

## 2016-01-24 ENCOUNTER — Emergency Department (HOSPITAL_COMMUNITY): Payer: Medicare Other

## 2016-01-24 ENCOUNTER — Encounter (HOSPITAL_COMMUNITY): Payer: Self-pay | Admitting: Emergency Medicine

## 2016-01-24 ENCOUNTER — Emergency Department (HOSPITAL_COMMUNITY)
Admission: EM | Admit: 2016-01-24 | Discharge: 2016-01-24 | Disposition: A | Discharge status: Home / Self Care | Payer: Medicare Other | Attending: Emergency Medicine | Admitting: Emergency Medicine

## 2016-01-24 DIAGNOSIS — Z8546 Personal history of malignant neoplasm of prostate: Secondary | ICD-10-CM | POA: Insufficient documentation

## 2016-01-24 DIAGNOSIS — E785 Hyperlipidemia, unspecified: Secondary | ICD-10-CM | POA: Diagnosis not present

## 2016-01-24 DIAGNOSIS — R0602 Shortness of breath: Secondary | ICD-10-CM | POA: Diagnosis not present

## 2016-01-24 DIAGNOSIS — I10 Essential (primary) hypertension: Secondary | ICD-10-CM | POA: Diagnosis not present

## 2016-01-24 DIAGNOSIS — Z7984 Long term (current) use of oral hypoglycemic drugs: Secondary | ICD-10-CM | POA: Insufficient documentation

## 2016-01-24 DIAGNOSIS — Z7901 Long term (current) use of anticoagulants: Secondary | ICD-10-CM | POA: Diagnosis not present

## 2016-01-24 DIAGNOSIS — K219 Gastro-esophageal reflux disease without esophagitis: Secondary | ICD-10-CM | POA: Diagnosis not present

## 2016-01-24 DIAGNOSIS — Z79899 Other long term (current) drug therapy: Secondary | ICD-10-CM | POA: Insufficient documentation

## 2016-01-24 DIAGNOSIS — E119 Type 2 diabetes mellitus without complications: Secondary | ICD-10-CM | POA: Insufficient documentation

## 2016-01-24 DIAGNOSIS — Z8739 Personal history of other diseases of the musculoskeletal system and connective tissue: Secondary | ICD-10-CM | POA: Insufficient documentation

## 2016-01-24 DIAGNOSIS — R079 Chest pain, unspecified: Secondary | ICD-10-CM | POA: Diagnosis not present

## 2016-01-24 DIAGNOSIS — I4892 Unspecified atrial flutter: Secondary | ICD-10-CM | POA: Insufficient documentation

## 2016-01-24 LAB — CBC WITH DIFFERENTIAL/PLATELET
BASOS PCT: 0 %
Basophils Absolute: 0 10*3/uL (ref 0.0–0.1)
EOS ABS: 0.1 10*3/uL (ref 0.0–0.7)
Eosinophils Relative: 1 %
HCT: 42.2 % (ref 39.0–52.0)
HEMOGLOBIN: 14.2 g/dL (ref 13.0–17.0)
Lymphocytes Relative: 14 %
Lymphs Abs: 1.4 10*3/uL (ref 0.7–4.0)
MCH: 31.3 pg (ref 26.0–34.0)
MCHC: 33.6 g/dL (ref 30.0–36.0)
MCV: 93.2 fL (ref 78.0–100.0)
MONO ABS: 0.8 10*3/uL (ref 0.1–1.0)
MONOS PCT: 7 %
NEUTROS PCT: 78 %
Neutro Abs: 7.9 10*3/uL — ABNORMAL HIGH (ref 1.7–7.7)
Platelets: 175 10*3/uL (ref 150–400)
RBC: 4.53 MIL/uL (ref 4.22–5.81)
RDW: 13.4 % (ref 11.5–15.5)
WBC: 10.2 10*3/uL (ref 4.0–10.5)

## 2016-01-24 LAB — BASIC METABOLIC PANEL
Anion gap: 13 (ref 5–15)
BUN: 18 mg/dL (ref 6–20)
CHLORIDE: 102 mmol/L (ref 101–111)
CO2: 24 mmol/L (ref 22–32)
CREATININE: 1.31 mg/dL — AB (ref 0.61–1.24)
Calcium: 9.8 mg/dL (ref 8.9–10.3)
GFR calc Af Amer: 57 mL/min — ABNORMAL LOW (ref >60)
GFR calc non Af Amer: 49 mL/min — ABNORMAL LOW (ref >60)
GLUCOSE: 148 mg/dL — AB (ref 65–99)
Potassium: 4 mmol/L (ref 3.5–5.1)
SODIUM: 139 mmol/L (ref 135–145)

## 2016-01-24 LAB — TROPONIN I

## 2016-01-24 MED ORDER — GI COCKTAIL ~~LOC~~
30.0000 mL | Freq: Once | ORAL | Status: AC
Start: 2016-01-24 — End: 2016-01-24
  Administered 2016-01-24: 30 mL via ORAL
  Filled 2016-01-24: qty 30

## 2016-01-24 MED ORDER — PANTOPRAZOLE SODIUM 40 MG PO TBEC
40.0000 mg | DELAYED_RELEASE_TABLET | Freq: Once | ORAL | Status: AC
  Administered 2016-01-24: 40 mg via ORAL
  Filled 2016-01-24: qty 1

## 2016-01-24 MED ORDER — PANTOPRAZOLE SODIUM 40 MG PO TBEC: 40.0000 mg | DELAYED_RELEASE_TABLET | Freq: Every day | ORAL | Status: DC

## 2016-01-24 NOTE — Discharge Instructions (Signed)
EKG and cardiac enzyme were normal. Follow up with your cardiologist on Friday. Return if worse in anyway. Recommend restarting your stomach medication.

## 2016-01-24 NOTE — ED Notes (Signed)
Today at 11am PT developed chest pain in central chest with radiation to jaw. PT reports no radiation at this time. PT denies any associated symptoms.

## 2016-01-24 NOTE — ED Provider Notes (Signed)
CSN: [id number]     Arrival date & time 01/24/16  1715 History   First MD Initiated Contact with Patient 01/24/16 1718     Chief Complaint  Patient presents with  . Chest Pain     (Consider location/radiation/quality/duration/timing/severity/associated sxs/prior Treatment) HPI.... Lower central chest pain since approximately 9 AM today described as sharp. Pain is worse with deep breath and eating seemed to help the pain. He was diagnosed with atrial fibrillation in the fall of 2016 with a successful cardioversion. He has been in normal sinus rhythm since. No history of myocardial infarction. He does have a history of hiatal hernia. No dyspnea, diaphoresis, nausea. Nonsmoker.  Past Medical History  Diagnosis Date  . Dyslipidemia   . Hypertension 01/16/11    ECHOCARDIOGRAM-NORMAL EF>55% STRESS  MYOCARDIAL PERFUSION 06/29/08 NORMAL.  Marland Kitchen Hiatal hernia   . Prostate cancer ([place]) 2001    rx w/ seed implantation  . Type II diabetes mellitus ([place])     TYPE 2  . GERD (gastroesophageal reflux disease)   . Cervical spondylosis 10/25/2014    CT C- spine 10/24/14:Cervical spondylosis causing osseous foraminal stenosis at several levels. No acute cervical spine findings.  . Type 2 diabetes mellitus ([place])     Diagnosed 10/25/14   . Paroxysmal atrial flutter [facility])    Past Surgical History  Procedure Laterality Date  . Insertion prostate radiation seed  2001  . Tee without cardioversion N/A 10/26/2014    Procedure: TRANSESOPHAGEAL ECHOCARDIOGRAM (TEE);  Surgeon: Thayer Headings, MD;  Location: [place];  Service: Cardiovascular;  Laterality: N/A;  . Tee without cardioversion N/A 10/26/2014    Procedure: TRANSESOPHAGEAL ECHOCARDIOGRAM (TEE);  Surgeon: Thayer Headings, MD;  Location: [place];  Service: Cardiovascular;  Laterality: N/A;  . Cardioversion N/A 10/26/2014    Procedure: CARDIOVERSION;  Surgeon: Thayer Headings, MD;  Location: [facility];  Service: Cardiovascular;  Laterality:  N/A;   Family History  Problem Relation Age of Onset  . Cancer - Ovarian    . Heart attack      70's   Social History  Substance Use Topics  . Smoking status: Never Smoker   . Smokeless tobacco: Never Used  . Alcohol Use: 0.0 oz/week    0 Standard drinks or equivalent per week     Comment: 1-2 drinks/week    Review of Systems  All other systems reviewed and are negative.     Allergies  Byetta 10 mcg pen; Prednisone; and Corticosteroids  Home Medications   Prior to Admission medications   Medication Sig Start Date End Date Taking? Authorizing Provider  ELIQUIS 2.5 MG TABS tablet TAKE 1 TABLET (2.5 MG TOTAL) BY MOUTH 2 (TWO) TIMES DAILY. 11/01/15  Yes Brittainy Erie Noe, PA-C  l-methylfolate-B6-B12 (METANX) 3-35-2 MG TABS Take 1 tablet by mouth 2 (two) times daily.   Yes Historical Provider, MD  levothyroxine (SYNTHROID, LEVOTHROID) 25 MCG tablet Take 25 mcg by mouth See admin instructions. Pharmacy states they have not filled medication since 2014 however patient states he is taking this on sundays only per patient   Yes Historical Provider, MD  MATZIM LA 240 MG 24 hr tablet TAKE 1 TABLET (240 MG TOTAL) BY MOUTH DAILY. 11/01/15  Yes Lorretta Harp, MD  metFORMIN (GLUCOPHAGE) 500 MG tablet Take 500 mg by mouth 2 (two) times daily with a meal.   Yes Historical Provider, MD  pantoprazole (PROTONIX) 40 MG tablet Take 1 tablet (40 mg total) by mouth 2 (two) times daily. 05/04/15  Yes Lorretta Harp, MD  pioglitazone (ACTOS) 30 MG tablet Take 30 mg by mouth daily. 10/28/14  Yes Historical Provider, MD  pravastatin (PRAVACHOL) 20 MG tablet Take 1 tablet (20 mg total) by mouth daily. 01/06/15  Yes Lorretta Harp, MD   BP 163/68 mmHg  Pulse 84  Temp(Src) 98.2 F (36.8 C) (Oral)  Resp 19  Ht 5\' 9"  (1.753 m)  Wt 170 lb (77.111 kg)  BMI 25.09 kg/m2  SpO2 96% Physical Exam  Constitutional: He is oriented to person, place, and time. He appears well-developed and well-nourished.   HENT:  Head: Normocephalic and atraumatic.  Eyes: Conjunctivae and EOM are normal. Pupils are equal, round, and reactive to light.  Neck: Normal range of motion. Neck supple.  Cardiovascular: Normal rate and regular rhythm.   Pulmonary/Chest: Effort normal and breath sounds normal.  Abdominal: Soft. Bowel sounds are normal.  Musculoskeletal: Normal range of motion.  Neurological: He is alert and oriented to person, place, and time.  Skin: Skin is warm and dry.  Psychiatric: He has a normal mood and affect. His behavior is normal.  Nursing note and vitals reviewed.   ED Course  Procedures (including critical care time) Labs Review Labs Reviewed  BASIC METABOLIC PANEL - Abnormal; Notable for the following:    Glucose, Bld 148 (*)    Creatinine, Ser 1.31 (*)    GFR calc non Af Amer 49 (*)    GFR calc Af Amer 57 (*)    All other components within normal limits  CBC WITH DIFFERENTIAL/PLATELET - Abnormal; Notable for the following:    Neutro Abs 7.9 (*)    All other components within normal limits  TROPONIN I    Imaging Review Dg Chest Port 1 View  01/24/2016  CLINICAL DATA:  Central chest pain since this morning. Pain worse during inspiration. No reported shortness of breath. EXAM: PORTABLE CHEST 1 VIEW COMPARISON:  10/24/2014 FINDINGS: Mild opacity at the left lung base is similar to the prior study consistent with atelectasis/scarring. Lungs otherwise clear. No pleural effusion or pneumothorax. Heart is mildly enlarged. No mediastinal or hilar masses or evidence of adenopathy. Bony thorax is intact. IMPRESSION: No acute cardiopulmonary disease. Electronically Signed   By: Lajean Manes M.D.   On: 01/24/2016 17:46   I have personally reviewed and evaluated these images and lab results as part of my medical decision-making.   EKG Interpretation   Date/Time:  Wednesday January 24 2016 17:29:15 EST Ventricular Rate:  78 PR Interval:  205 QRS Duration: 99 QT Interval:  378 QTC  Calculation: 430 R Axis:   -3 Text Interpretation:  Sinus rhythm Low voltage, precordial leads  Borderline repolarization abnormality Confirmed by Aquarius Latouche  MD, Lottie Siska ([id number])  on 01/24/2016 6:33:17 PM      MDM   Final diagnoses:  Chest pain, unspecified chest pain type    Patient is in no acute distress. Screening labs,  EKG, troponin, chest x-ray all show no acute findings. This was discussed with the patient and his daughter. He has cardiology follow-up in 2 days. Patient understands to return if symptoms worsen.    Nat Christen, MD 01/24/16 2203

## 2016-01-24 NOTE — ED Notes (Signed)
Per EMS- pt was seen by PCP for chest pain earlier that does not radiate today that comes and goes. Pt describes it as a burning or sharpness that comes and goes. Pt has hx of hiatal hernia. BP 170/80 HR 80. Hx of afib but controlled. Pain 3.10. Pt had 324 ASA. No IV access.

## 2016-01-26 ENCOUNTER — Ambulatory Visit (INDEPENDENT_AMBULATORY_CARE_PROVIDER_SITE_OTHER): Payer: Medicare Other | Admitting: Cardiovascular Disease

## 2016-01-26 ENCOUNTER — Encounter: Payer: Self-pay | Admitting: Cardiovascular Disease

## 2016-01-26 VITALS — BP 110/64 | HR 67 | Ht 69.0 in | Wt 177.0 lb

## 2016-01-26 DIAGNOSIS — E785 Hyperlipidemia, unspecified: Secondary | ICD-10-CM

## 2016-01-26 DIAGNOSIS — I10 Essential (primary) hypertension: Secondary | ICD-10-CM

## 2016-01-26 DIAGNOSIS — I4891 Unspecified atrial fibrillation: Secondary | ICD-10-CM

## 2016-01-26 NOTE — Assessment & Plan Note (Signed)
History of hyperlipidemia on pravastatin followed by his PCP 

## 2016-01-26 NOTE — Patient Instructions (Signed)

## 2016-01-26 NOTE — Assessment & Plan Note (Signed)
History of hypertension blood pressure measured 110/64. He currently is not on antihypertensive medications.

## 2016-01-26 NOTE — Progress Notes (Signed)
01/26/2016 Charles Boyer   Aug 14, 1932  [id number]  Primary Physician Charles Gravel, MD Primary Cardiologist: Lorretta Harp MD Renae Gloss   HPI:  Charles Boyer is a 82 year old mild to moderately overweight married Caucasian male father of 2, grandfather 2 grandchildren who I last saw in the office 12/15/14 .  His primary care physician is Dr. Jani Boyer. 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 He was admitted to [facility] 10/24/14 with syncope, A. Fib with RVR and chest pain. He ruled out for myocardial infarction. A 2-D echo was normal. He underwent TEE guided DC cardioversion by Dr. Cathie Olden 10/26/14 successfully to sinus rhythm. He was discharged home on Eliquis oral anticoagulation. He remains in normal sinus rhythm. His last Myoview performed by me in 2009 was normal. He was seen in the emergency room 01/24/16 with atypical chest pain. His workup was unremarkable. He thought that it was related to hiatal hernia. When he got home he took 2 tablespoons of apple cider vinegar and his pain resolved. His EKG showed sinus rhythm without ST or T-wave changes.   Current Outpatient Prescriptions  Medication Sig Dispense Refill  . colchicine 0.6 MG tablet Take 0.6 mg by mouth as needed.    Marland Kitchen ELIQUIS 2.5 MG TABS tablet TAKE 1 TABLET (2.5 MG TOTAL) BY MOUTH 2 (TWO) TIMES DAILY. 60 tablet 2  . l-methylfolate-B6-B12 (METANX) 3-35-2 MG TABS Take 1 tablet by mouth 2 (two) times daily.    Marland Kitchen levothyroxine (SYNTHROID, LEVOTHROID) 25 MCG tablet Take 25 mcg by mouth See admin instructions. Pharmacy states they have not filled medication since 2014 however patient states he is taking this on sundays only per patient    . MATZIM LA 240 MG 24 hr tablet TAKE 1 TABLET (240 MG TOTAL) BY MOUTH DAILY. 30 tablet 3  . metFORMIN (GLUCOPHAGE) 500 MG tablet Take 500 mg by mouth 2 (two) times daily with a meal.    . pantoprazole (PROTONIX) 40 MG tablet Take 1 tablet (40  mg total) by mouth 2 (two) times daily. 30 tablet 8  . pioglitazone (ACTOS) 30 MG tablet Take 30 mg by mouth daily.  13  . pravastatin (PRAVACHOL) 20 MG tablet Take 1 tablet (20 mg total) by mouth daily. (Patient taking differently: Take 10 mg by mouth daily. ) 90 tablet 3   No current facility-administered medications for this visit.    Allergies  Allergen Reactions  . Byetta 10 Mcg Pen [Exenatide]   . Prednisone Other (See Comments)    MEMORY ISSUES. HALLUCINATIONS.  Marland Kitchen Corticosteroids Other (See Comments)    HALLUCINATIONS, MEMORY ISSUES    Social History   Social History  . Marital Status: Married    Spouse Name: N/A  . Number of Children: N/A  . Years of Education: N/A   Occupational History  . 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 Smoking status: Never Smoker   . Smokeless tobacco: Never Used  . Alcohol Use: 0.0 oz/week    0 Standard drinks or equivalent per week     Comment: 1-2 drinks/week  . Drug Use: No  . Sexual Activity: Not on file   Other Topics Concern  . Not on file   Social History Narrative   Lives with wife     Review of Systems: General: negative for chills, fever, night sweats or weight changes.  Cardiovascular: negative for chest pain,  dyspnea on exertion, edema, orthopnea, palpitations, paroxysmal nocturnal dyspnea or shortness of breath Dermatological: negative for rash Respiratory: negative for cough or wheezing Urologic: negative for hematuria Abdominal: negative for nausea, vomiting, diarrhea, bright red blood per rectum, melena, or hematemesis Neurologic: negative for visual changes, syncope, or dizziness All other systems reviewed and are otherwise negative except as noted above.    Blood pressure 110/64, pulse 67, height '5\' 9"'  (1.753 m), weight 177 lb (80.287 kg).  General appearance: alert and no distress Neck: no adenopathy, no carotid bruit, no JVD, supple, symmetrical, trachea midline and thyroid not enlarged,  symmetric, no tenderness/mass/nodules Lungs: clear to auscultation bilaterally Heart: regular rate and rhythm, S1, S2 normal, no murmur, click, rub or gallop Extremities: extremities normal, atraumatic, no cyanosis or edema  EKG not performed today  ASSESSMENT AND PLAN:   Atrial fibrillation with rapid ventricular response History of paroxysmal atrial fibrillation status post TEE guided DC cardioversion by Dr. Acie Fredrickson 10/26/14 maintaining sinus rhythm on Eliquis oral anticoagulation.  HTN (hypertension) History of hypertension blood pressure measured 110/64. He currently is not on antihypertensive medications.  Hyperlipidemia History of hyperlipidemia on pravastatin followed by his PCP      Lorretta Harp MD [facility] 01/26/2016 10:51 AM

## 2016-01-26 NOTE — Assessment & Plan Note (Signed)
History of paroxysmal atrial fibrillation status post TEE guided DC cardioversion by Dr. Acie Fredrickson 10/26/14 maintaining sinus rhythm on Eliquis oral anticoagulation.

## 2016-02-01 ENCOUNTER — Other Ambulatory Visit: Payer: Self-pay | Admitting: Cardiovascular Disease

## 2016-02-01 DIAGNOSIS — R079 Chest pain, unspecified: Secondary | ICD-10-CM | POA: Diagnosis not present

## 2016-02-01 NOTE — Telephone Encounter (Signed)
Rx(s) sent to pharmacy electronically.  

## 2016-02-03 ENCOUNTER — Other Ambulatory Visit: Payer: Self-pay | Admitting: Cardiology

## 2016-02-06 ENCOUNTER — Encounter (HOSPITAL_COMMUNITY): Payer: Self-pay

## 2016-02-15 DIAGNOSIS — Z1389 Encounter for screening for other disorder: Secondary | ICD-10-CM | POA: Diagnosis not present

## 2016-02-15 DIAGNOSIS — E119 Type 2 diabetes mellitus without complications: Secondary | ICD-10-CM | POA: Diagnosis not present

## 2016-02-15 DIAGNOSIS — E785 Hyperlipidemia, unspecified: Secondary | ICD-10-CM | POA: Diagnosis not present

## 2016-03-06 ENCOUNTER — Other Ambulatory Visit: Payer: Self-pay | Admitting: Cardiovascular Disease

## 2016-03-06 DIAGNOSIS — K219 Gastro-esophageal reflux disease without esophagitis: Secondary | ICD-10-CM | POA: Diagnosis not present

## 2016-03-06 NOTE — Telephone Encounter (Signed)
Rx(s) sent to pharmacy electronically.  

## 2016-04-15 DIAGNOSIS — E119 Type 2 diabetes mellitus without complications: Secondary | ICD-10-CM | POA: Diagnosis not present

## 2016-04-15 DIAGNOSIS — E039 Hypothyroidism, unspecified: Secondary | ICD-10-CM | POA: Diagnosis not present

## 2016-04-15 DIAGNOSIS — I10 Essential (primary) hypertension: Secondary | ICD-10-CM | POA: Diagnosis not present

## 2016-04-18 DIAGNOSIS — E039 Hypothyroidism, unspecified: Secondary | ICD-10-CM | POA: Diagnosis not present

## 2016-04-18 DIAGNOSIS — I10 Essential (primary) hypertension: Secondary | ICD-10-CM | POA: Diagnosis not present

## 2016-04-18 DIAGNOSIS — E119 Type 2 diabetes mellitus without complications: Secondary | ICD-10-CM | POA: Diagnosis not present

## 2016-05-08 ENCOUNTER — Other Ambulatory Visit: Payer: Self-pay | Admitting: Cardiovascular Disease

## 2016-05-08 NOTE — Telephone Encounter (Signed)
Rx Refill

## 2016-07-23 DIAGNOSIS — H52223 Regular astigmatism, bilateral: Secondary | ICD-10-CM | POA: Diagnosis not present

## 2016-07-23 DIAGNOSIS — E119 Type 2 diabetes mellitus without complications: Secondary | ICD-10-CM | POA: Diagnosis not present

## 2016-07-23 DIAGNOSIS — H353131 Nonexudative age-related macular degeneration, bilateral, early dry stage: Secondary | ICD-10-CM | POA: Diagnosis not present

## 2016-07-23 DIAGNOSIS — H26491 Other secondary cataract, right eye: Secondary | ICD-10-CM | POA: Diagnosis not present

## 2016-08-14 DIAGNOSIS — I10 Essential (primary) hypertension: Secondary | ICD-10-CM | POA: Diagnosis not present

## 2016-08-14 DIAGNOSIS — E119 Type 2 diabetes mellitus without complications: Secondary | ICD-10-CM | POA: Diagnosis not present

## 2016-08-14 DIAGNOSIS — E039 Hypothyroidism, unspecified: Secondary | ICD-10-CM | POA: Diagnosis not present

## 2016-08-20 DIAGNOSIS — E119 Type 2 diabetes mellitus without complications: Secondary | ICD-10-CM | POA: Diagnosis not present

## 2016-08-20 DIAGNOSIS — N183 Chronic kidney disease, stage 3 (moderate): Secondary | ICD-10-CM | POA: Diagnosis not present

## 2016-08-20 DIAGNOSIS — I10 Essential (primary) hypertension: Secondary | ICD-10-CM | POA: Diagnosis not present

## 2016-08-20 DIAGNOSIS — H26491 Other secondary cataract, right eye: Secondary | ICD-10-CM | POA: Diagnosis not present

## 2016-08-20 DIAGNOSIS — E7800 Pure hypercholesterolemia, unspecified: Secondary | ICD-10-CM | POA: Diagnosis not present

## 2016-08-20 DIAGNOSIS — Z961 Presence of intraocular lens: Secondary | ICD-10-CM | POA: Diagnosis not present

## 2016-08-20 DIAGNOSIS — H18413 Arcus senilis, bilateral: Secondary | ICD-10-CM | POA: Diagnosis not present

## 2016-08-29 DIAGNOSIS — I10 Essential (primary) hypertension: Secondary | ICD-10-CM | POA: Diagnosis not present

## 2016-09-05 DIAGNOSIS — E7800 Pure hypercholesterolemia, unspecified: Secondary | ICD-10-CM | POA: Diagnosis not present

## 2016-09-05 DIAGNOSIS — E119 Type 2 diabetes mellitus without complications: Secondary | ICD-10-CM | POA: Diagnosis not present

## 2016-09-05 DIAGNOSIS — I10 Essential (primary) hypertension: Secondary | ICD-10-CM | POA: Diagnosis not present

## 2016-11-28 DIAGNOSIS — E119 Type 2 diabetes mellitus without complications: Secondary | ICD-10-CM | POA: Diagnosis not present

## 2016-11-28 DIAGNOSIS — I10 Essential (primary) hypertension: Secondary | ICD-10-CM | POA: Diagnosis not present

## 2016-12-05 DIAGNOSIS — Z125 Encounter for screening for malignant neoplasm of prostate: Secondary | ICD-10-CM | POA: Diagnosis not present

## 2016-12-05 DIAGNOSIS — E039 Hypothyroidism, unspecified: Secondary | ICD-10-CM | POA: Diagnosis not present

## 2016-12-05 DIAGNOSIS — I10 Essential (primary) hypertension: Secondary | ICD-10-CM | POA: Diagnosis not present

## 2016-12-05 DIAGNOSIS — E119 Type 2 diabetes mellitus without complications: Secondary | ICD-10-CM | POA: Diagnosis not present

## 2017-04-09 DIAGNOSIS — E119 Type 2 diabetes mellitus without complications: Secondary | ICD-10-CM | POA: Diagnosis not present

## 2017-04-09 DIAGNOSIS — I10 Essential (primary) hypertension: Secondary | ICD-10-CM | POA: Diagnosis not present

## 2017-04-09 DIAGNOSIS — Z125 Encounter for screening for malignant neoplasm of prostate: Secondary | ICD-10-CM | POA: Diagnosis not present

## 2017-04-09 DIAGNOSIS — M109 Gout, unspecified: Secondary | ICD-10-CM | POA: Diagnosis not present

## 2017-04-09 DIAGNOSIS — E039 Hypothyroidism, unspecified: Secondary | ICD-10-CM | POA: Diagnosis not present

## 2017-04-12 ENCOUNTER — Encounter: Payer: Self-pay | Admitting: Internal Medicine

## 2017-04-12 DIAGNOSIS — R079 Chest pain, unspecified: Secondary | ICD-10-CM | POA: Diagnosis not present

## 2017-04-12 DIAGNOSIS — Z79899 Other long term (current) drug therapy: Secondary | ICD-10-CM | POA: Diagnosis not present

## 2017-04-12 DIAGNOSIS — E119 Type 2 diabetes mellitus without complications: Secondary | ICD-10-CM | POA: Diagnosis not present

## 2017-04-12 DIAGNOSIS — E785 Hyperlipidemia, unspecified: Secondary | ICD-10-CM | POA: Diagnosis not present

## 2017-04-12 DIAGNOSIS — Z8679 Personal history of other diseases of the circulatory system: Secondary | ICD-10-CM | POA: Diagnosis not present

## 2017-04-12 DIAGNOSIS — Z7984 Long term (current) use of oral hypoglycemic drugs: Secondary | ICD-10-CM | POA: Diagnosis not present

## 2017-04-12 DIAGNOSIS — I4891 Unspecified atrial fibrillation: Secondary | ICD-10-CM | POA: Diagnosis not present

## 2017-04-12 DIAGNOSIS — E1122 Type 2 diabetes mellitus with diabetic chronic kidney disease: Secondary | ICD-10-CM | POA: Diagnosis not present

## 2017-04-12 DIAGNOSIS — R55 Syncope and collapse: Secondary | ICD-10-CM | POA: Diagnosis not present

## 2017-04-12 DIAGNOSIS — R072 Precordial pain: Secondary | ICD-10-CM | POA: Diagnosis not present

## 2017-04-12 DIAGNOSIS — I129 Hypertensive chronic kidney disease with stage 1 through stage 4 chronic kidney disease, or unspecified chronic kidney disease: Secondary | ICD-10-CM | POA: Diagnosis not present

## 2017-04-12 DIAGNOSIS — I10 Essential (primary) hypertension: Secondary | ICD-10-CM | POA: Diagnosis not present

## 2017-04-12 DIAGNOSIS — N183 Chronic kidney disease, stage 3 (moderate): Secondary | ICD-10-CM | POA: Diagnosis not present

## 2017-04-12 DIAGNOSIS — E039 Hypothyroidism, unspecified: Secondary | ICD-10-CM | POA: Diagnosis not present

## 2017-04-13 DIAGNOSIS — E1122 Type 2 diabetes mellitus with diabetic chronic kidney disease: Secondary | ICD-10-CM | POA: Diagnosis not present

## 2017-04-13 DIAGNOSIS — Z79899 Other long term (current) drug therapy: Secondary | ICD-10-CM | POA: Diagnosis not present

## 2017-04-13 DIAGNOSIS — R079 Chest pain, unspecified: Secondary | ICD-10-CM | POA: Diagnosis not present

## 2017-04-13 DIAGNOSIS — I129 Hypertensive chronic kidney disease with stage 1 through stage 4 chronic kidney disease, or unspecified chronic kidney disease: Secondary | ICD-10-CM | POA: Diagnosis not present

## 2017-04-13 DIAGNOSIS — Z8679 Personal history of other diseases of the circulatory system: Secondary | ICD-10-CM | POA: Diagnosis not present

## 2017-04-13 DIAGNOSIS — E039 Hypothyroidism, unspecified: Secondary | ICD-10-CM | POA: Diagnosis not present

## 2017-04-13 DIAGNOSIS — Z7984 Long term (current) use of oral hypoglycemic drugs: Secondary | ICD-10-CM | POA: Diagnosis not present

## 2017-04-13 DIAGNOSIS — R55 Syncope and collapse: Secondary | ICD-10-CM | POA: Diagnosis not present

## 2017-04-13 DIAGNOSIS — N183 Chronic kidney disease, stage 3 (moderate): Secondary | ICD-10-CM | POA: Diagnosis not present

## 2017-04-15 DIAGNOSIS — K219 Gastro-esophageal reflux disease without esophagitis: Secondary | ICD-10-CM | POA: Diagnosis not present

## 2017-04-15 DIAGNOSIS — Z0000 Encounter for general adult medical examination without abnormal findings: Secondary | ICD-10-CM | POA: Diagnosis not present

## 2017-04-21 DIAGNOSIS — E039 Hypothyroidism, unspecified: Secondary | ICD-10-CM | POA: Diagnosis not present

## 2017-04-21 DIAGNOSIS — E7800 Pure hypercholesterolemia, unspecified: Secondary | ICD-10-CM | POA: Diagnosis not present

## 2017-05-26 DIAGNOSIS — E039 Hypothyroidism, unspecified: Secondary | ICD-10-CM | POA: Diagnosis not present

## 2017-05-26 DIAGNOSIS — E119 Type 2 diabetes mellitus without complications: Secondary | ICD-10-CM | POA: Diagnosis not present

## 2017-05-26 DIAGNOSIS — R079 Chest pain, unspecified: Secondary | ICD-10-CM | POA: Diagnosis not present

## 2017-05-26 DIAGNOSIS — I10 Essential (primary) hypertension: Secondary | ICD-10-CM | POA: Diagnosis not present

## 2017-05-30 DIAGNOSIS — H353210 Exudative age-related macular degeneration, right eye, stage unspecified: Secondary | ICD-10-CM | POA: Diagnosis not present

## 2017-06-06 DIAGNOSIS — H353132 Nonexudative age-related macular degeneration, bilateral, intermediate dry stage: Secondary | ICD-10-CM | POA: Diagnosis not present

## 2017-06-06 DIAGNOSIS — H35423 Microcystoid degeneration of retina, bilateral: Secondary | ICD-10-CM | POA: Diagnosis not present

## 2017-06-06 DIAGNOSIS — H43813 Vitreous degeneration, bilateral: Secondary | ICD-10-CM | POA: Diagnosis not present

## 2017-08-27 DIAGNOSIS — E119 Type 2 diabetes mellitus without complications: Secondary | ICD-10-CM | POA: Diagnosis not present

## 2017-08-27 DIAGNOSIS — I10 Essential (primary) hypertension: Secondary | ICD-10-CM | POA: Diagnosis not present

## 2017-09-01 DIAGNOSIS — H353132 Nonexudative age-related macular degeneration, bilateral, intermediate dry stage: Secondary | ICD-10-CM | POA: Diagnosis not present

## 2017-09-03 DIAGNOSIS — E118 Type 2 diabetes mellitus with unspecified complications: Secondary | ICD-10-CM | POA: Diagnosis not present

## 2017-09-03 DIAGNOSIS — I10 Essential (primary) hypertension: Secondary | ICD-10-CM | POA: Diagnosis not present

## 2017-09-03 DIAGNOSIS — E7800 Pure hypercholesterolemia, unspecified: Secondary | ICD-10-CM | POA: Diagnosis not present

## 2017-09-03 DIAGNOSIS — E039 Hypothyroidism, unspecified: Secondary | ICD-10-CM | POA: Diagnosis not present

## 2017-11-26 DIAGNOSIS — E039 Hypothyroidism, unspecified: Secondary | ICD-10-CM | POA: Diagnosis not present

## 2017-11-26 DIAGNOSIS — E118 Type 2 diabetes mellitus with unspecified complications: Secondary | ICD-10-CM | POA: Diagnosis not present

## 2017-12-08 DIAGNOSIS — E118 Type 2 diabetes mellitus with unspecified complications: Secondary | ICD-10-CM | POA: Diagnosis not present

## 2017-12-08 DIAGNOSIS — R05 Cough: Secondary | ICD-10-CM | POA: Diagnosis not present

## 2017-12-08 DIAGNOSIS — K7689 Other specified diseases of liver: Secondary | ICD-10-CM | POA: Diagnosis not present

## 2017-12-10 ENCOUNTER — Other Ambulatory Visit: Payer: Self-pay | Admitting: Internal Medicine

## 2017-12-10 DIAGNOSIS — R945 Abnormal results of liver function studies: Secondary | ICD-10-CM

## 2017-12-10 DIAGNOSIS — R7989 Other specified abnormal findings of blood chemistry: Secondary | ICD-10-CM

## 2017-12-12 ENCOUNTER — Ambulatory Visit
Admission: RE | Admit: 2017-12-12 | Discharge: 2017-12-12 | Disposition: A | Discharge status: Home / Self Care | Payer: Medicare Other | Source: Ambulatory Visit | Attending: Internal Medicine | Admitting: Internal Medicine

## 2017-12-12 DIAGNOSIS — K8020 Calculus of gallbladder without cholecystitis without obstruction: Secondary | ICD-10-CM | POA: Diagnosis not present

## 2017-12-12 DIAGNOSIS — R945 Abnormal results of liver function studies: Secondary | ICD-10-CM

## 2017-12-12 DIAGNOSIS — R7989 Other specified abnormal findings of blood chemistry: Secondary | ICD-10-CM

## 2018-01-01 DIAGNOSIS — E118 Type 2 diabetes mellitus with unspecified complications: Secondary | ICD-10-CM | POA: Diagnosis not present

## 2018-01-01 DIAGNOSIS — K7689 Other specified diseases of liver: Secondary | ICD-10-CM | POA: Diagnosis not present

## 2018-01-08 DIAGNOSIS — L989 Disorder of the skin and subcutaneous tissue, unspecified: Secondary | ICD-10-CM | POA: Diagnosis not present

## 2018-01-08 DIAGNOSIS — E119 Type 2 diabetes mellitus without complications: Secondary | ICD-10-CM | POA: Diagnosis not present

## 2018-02-17 DIAGNOSIS — H353132 Nonexudative age-related macular degeneration, bilateral, intermediate dry stage: Secondary | ICD-10-CM | POA: Diagnosis not present

## 2018-02-17 DIAGNOSIS — H43813 Vitreous degeneration, bilateral: Secondary | ICD-10-CM | POA: Diagnosis not present

## 2018-02-17 DIAGNOSIS — H35423 Microcystoid degeneration of retina, bilateral: Secondary | ICD-10-CM | POA: Diagnosis not present

## 2018-02-17 DIAGNOSIS — H43822 Vitreomacular adhesion, left eye: Secondary | ICD-10-CM | POA: Diagnosis not present

## 2018-03-02 DIAGNOSIS — E119 Type 2 diabetes mellitus without complications: Secondary | ICD-10-CM | POA: Diagnosis not present

## 2018-03-09 DIAGNOSIS — E039 Hypothyroidism, unspecified: Secondary | ICD-10-CM | POA: Diagnosis not present

## 2018-03-09 DIAGNOSIS — I10 Essential (primary) hypertension: Secondary | ICD-10-CM | POA: Diagnosis not present

## 2018-03-09 DIAGNOSIS — E119 Type 2 diabetes mellitus without complications: Secondary | ICD-10-CM | POA: Diagnosis not present

## 2018-03-09 DIAGNOSIS — Z79899 Other long term (current) drug therapy: Secondary | ICD-10-CM | POA: Diagnosis not present

## 2018-03-09 DIAGNOSIS — E7800 Pure hypercholesterolemia, unspecified: Secondary | ICD-10-CM | POA: Diagnosis not present

## 2018-03-20 DIAGNOSIS — H35359 Cystoid macular degeneration, unspecified eye: Secondary | ICD-10-CM | POA: Diagnosis not present

## 2018-03-23 DIAGNOSIS — L418 Other parapsoriasis: Secondary | ICD-10-CM | POA: Diagnosis not present

## 2018-03-23 DIAGNOSIS — L299 Pruritus, unspecified: Secondary | ICD-10-CM | POA: Diagnosis not present

## 2018-03-23 DIAGNOSIS — L430 Hypertrophic lichen planus: Secondary | ICD-10-CM | POA: Diagnosis not present

## 2018-03-23 DIAGNOSIS — D485 Neoplasm of uncertain behavior of skin: Secondary | ICD-10-CM | POA: Diagnosis not present

## 2018-03-23 DIAGNOSIS — D229 Melanocytic nevi, unspecified: Secondary | ICD-10-CM | POA: Diagnosis not present

## 2018-03-23 DIAGNOSIS — L570 Actinic keratosis: Secondary | ICD-10-CM | POA: Diagnosis not present

## 2018-06-03 DIAGNOSIS — I10 Essential (primary) hypertension: Secondary | ICD-10-CM | POA: Diagnosis not present

## 2018-06-03 DIAGNOSIS — E7800 Pure hypercholesterolemia, unspecified: Secondary | ICD-10-CM | POA: Diagnosis not present

## 2018-06-03 DIAGNOSIS — E119 Type 2 diabetes mellitus without complications: Secondary | ICD-10-CM | POA: Diagnosis not present

## 2018-06-08 DIAGNOSIS — N183 Chronic kidney disease, stage 3 (moderate): Secondary | ICD-10-CM | POA: Diagnosis not present

## 2018-06-08 DIAGNOSIS — Z0000 Encounter for general adult medical examination without abnormal findings: Secondary | ICD-10-CM | POA: Diagnosis not present

## 2018-06-08 DIAGNOSIS — E119 Type 2 diabetes mellitus without complications: Secondary | ICD-10-CM | POA: Diagnosis not present

## 2018-06-08 DIAGNOSIS — E039 Hypothyroidism, unspecified: Secondary | ICD-10-CM | POA: Diagnosis not present

## 2018-06-08 DIAGNOSIS — I10 Essential (primary) hypertension: Secondary | ICD-10-CM | POA: Diagnosis not present

## 2018-06-16 ENCOUNTER — Other Ambulatory Visit: Payer: Self-pay

## 2018-06-16 NOTE — Patient Outreach (Signed)
[facility]) Care Management  06/16/2018  KHRIZ LIDDY 1932/05/13 [id number]   Medication Adherence call to Mr. [name] call patient, no answer voice mail is full patient is due on Glimepiride 2 mg.Mr. Aycock is showing past due under [place].   [facility] Management Direct Dial [phone]  Fax [phone] [email or web address]

## 2018-08-27 DIAGNOSIS — G8911 Acute pain due to trauma: Secondary | ICD-10-CM | POA: Diagnosis not present

## 2018-08-27 DIAGNOSIS — S51012A Laceration without foreign body of left elbow, initial encounter: Secondary | ICD-10-CM | POA: Diagnosis not present

## 2018-08-27 DIAGNOSIS — I4891 Unspecified atrial fibrillation: Secondary | ICD-10-CM | POA: Diagnosis not present

## 2018-08-27 DIAGNOSIS — S0101XA Laceration without foreign body of scalp, initial encounter: Secondary | ICD-10-CM | POA: Diagnosis not present

## 2018-08-27 DIAGNOSIS — Z043 Encounter for examination and observation following other accident: Secondary | ICD-10-CM | POA: Diagnosis not present

## 2018-08-27 DIAGNOSIS — S0003XA Contusion of scalp, initial encounter: Secondary | ICD-10-CM | POA: Diagnosis not present

## 2018-08-27 DIAGNOSIS — Z23 Encounter for immunization: Secondary | ICD-10-CM | POA: Diagnosis not present

## 2018-08-27 DIAGNOSIS — S0990XA Unspecified injury of head, initial encounter: Secondary | ICD-10-CM | POA: Diagnosis not present

## 2018-08-27 DIAGNOSIS — S0083XA Contusion of other part of head, initial encounter: Secondary | ICD-10-CM | POA: Diagnosis not present

## 2018-09-02 DIAGNOSIS — E119 Type 2 diabetes mellitus without complications: Secondary | ICD-10-CM | POA: Diagnosis not present

## 2018-09-02 DIAGNOSIS — I10 Essential (primary) hypertension: Secondary | ICD-10-CM | POA: Diagnosis not present

## 2018-09-02 DIAGNOSIS — Z4802 Encounter for removal of sutures: Secondary | ICD-10-CM | POA: Diagnosis not present

## 2018-09-02 DIAGNOSIS — S0101XD Laceration without foreign body of scalp, subsequent encounter: Secondary | ICD-10-CM | POA: Diagnosis not present

## 2018-09-30 DIAGNOSIS — H8110 Benign paroxysmal vertigo, unspecified ear: Secondary | ICD-10-CM | POA: Diagnosis not present

## 2019-04-14 ENCOUNTER — Ambulatory Visit (INDEPENDENT_AMBULATORY_CARE_PROVIDER_SITE_OTHER): Payer: Medicare Other | Admitting: Family

## 2019-04-14 ENCOUNTER — Other Ambulatory Visit: Payer: Self-pay

## 2019-04-14 ENCOUNTER — Encounter: Payer: Self-pay | Admitting: Family

## 2019-04-14 VITALS — Ht 69.0 in | Wt 177.0 lb

## 2019-04-14 DIAGNOSIS — M79671 Pain in right foot: Secondary | ICD-10-CM

## 2019-04-15 LAB — URIC ACID: Uric Acid, Serum: 8.4 mg/dL — ABNORMAL HIGH (ref 4.0–8.0)

## 2019-04-21 ENCOUNTER — Encounter: Payer: Self-pay | Admitting: Family

## 2019-04-21 DIAGNOSIS — M79671 Pain in right foot: Secondary | ICD-10-CM | POA: Diagnosis not present

## 2019-04-21 MED ORDER — LIDOCAINE HCL 1 % IJ SOLN
1.0000 mL | INTRAMUSCULAR | Status: AC | PRN
  Administered 2019-04-21: 11:00:00 1 mL

## 2019-04-21 MED ORDER — METHYLPREDNISOLONE ACETATE 40 MG/ML IJ SUSP
40.0000 mg | INTRAMUSCULAR | Status: AC | PRN
  Administered 2019-04-21: 40 mg via INTRA_ARTICULAR

## 2019-04-21 NOTE — Progress Notes (Signed)
Office Visit Note   Patient: Charles Boyer           Date of Birth: 08-05-1932           MRN: [id number] Visit Date: 04/14/2019              Requested by: Jani Gravel, [address] [place], [place] [address] PCP: Jani Gravel, MD  Chief Complaint  Patient presents with  . Right Foot - Pain      HPI:  The patient is an 85 year old gentleman seen today for evaluation of right great toe pain. States began yesterday and is quickly worsening.  The patient has had similar issues in the past feels this is a gout flare.  Complaining of some mild erythema.  Beginning to have pain with ambulation and range of motion at the MTP joint.  States he has had good relief with cortisone injections in the past.  He feels this is the only thing that can increase prove his pain.  He prefers not to take allopurinol or Uloric feels these are too expensive.  He states he is allergic to prednisone.  He is unable to describe an allergic response.  Declines prednisone prescription today.  Assessment & Plan: Visit Diagnoses:  1. Right foot pain     Plan: Depo-Medrol injection at the MTP joint today.  Patient tolerated well.  Discussed treating his gout medically.  We will draw a uric acid level today.  Will call patient with results.  Follow-Up Instructions: Return in about 2 weeks (around 04/28/2019), or if symptoms worsen or fail to improve.   Ortho Exam  Patient is alert, oriented, no adenopathy, well-dressed, normal affect, normal respiratory effort. On examination of the right foot.  There is mild erythema to the medial MTP joint line there is no swelling.  Does have tenderness to palpation pain with range of motion of the joint.  No warmth.  Imaging: No results found. No images are attached to the encounter.  Labs: Lab Results  Component Value Date   HGBA1C 7.0 (H) 10/24/2014   LABURIC 8.4 (H) 04/14/2019     Lab Results  Component Value Date   ALBUMIN 2.9 (L) 10/25/2014   ALBUMIN 3.4 (L) 10/24/2014   LABURIC 8.4 (H) 04/14/2019    Body mass index is 26.14 kg/m.  Orders:  Orders Placed This Encounter  Procedures  . Uric acid   No orders of the defined types were placed in this encounter.    Procedures: Small Joint Inj: R great MTP on 04/21/2019 10:59 AM Indications: pain Details: 25 G needle, dorsal approach Medications: 1 mL lidocaine 1 %; 40 mg methylPREDNISolone acetate 40 MG/ML Outcome: tolerated well, no immediate complications Consent was given by the patient.      Clinical Data: No additional findings.  ROS:  All other systems negative, except as noted in the HPI. Review of Systems  Constitutional: Negative for chills and fever.  Musculoskeletal: Positive for arthralgias and joint swelling.  Skin: Positive for color change.    Objective: Vital Signs: Ht _0  (1.753 m)   Wt 177 lb (80.3 kg)   BMI 26.14 kg/m   Specialty Comments:  No specialty comments available.  PMFS History: Patient Active Problem List   Diagnosis Date Noted  . Hyperlipidemia 12/15/2014  . HTN (hypertension) 10/26/2014  . Leukocytosis 10/25/2014  . Hyponatremia 10/25/2014  . Type 2 diabetes mellitus without complications ([place]) 18/56/3149  . Hepatic steatosis 10/25/2014  .  Cervical spondylosis 10/25/2014  . Atrial fibrillation with rapid ventricular response ([place]) 10/24/2014   Past Medical History:  Diagnosis Date  . Cervical spondylosis 10/25/2014   CT C- spine 10/24/14:Cervical spondylosis causing osseous foraminal stenosis at several levels. No acute cervical spine findings.  . Dyslipidemia   . GERD (gastroesophageal reflux disease)   . Hiatal hernia   . Hypertension 01/16/11   ECHOCARDIOGRAM-NORMAL EF>55% STRESS  MYOCARDIAL PERFUSION 06/29/08 NORMAL.  Marland Kitchen Paroxysmal atrial flutter ([place])   . Prostate cancer ([place]) 2001   rx w/ seed implantation  . Type 2 diabetes mellitus ([place])    Diagnosed 10/25/14   . Type II diabetes mellitus ([facility])     TYPE 2    Family History  Problem Relation Age of Onset  . Cancer - Ovarian Unknown   . Heart attack Unknown        70's    Past Surgical History:  Procedure Laterality Date  . CARDIOVERSION N/A 10/26/2014   Procedure: CARDIOVERSION;  Surgeon: Thayer Headings, MD;  Location: [place];  Service: Cardiovascular;  Laterality: N/A;  . [place]  2001  . TEE WITHOUT CARDIOVERSION N/A 10/26/2014   Procedure: TRANSESOPHAGEAL ECHOCARDIOGRAM (TEE);  Surgeon: Thayer Headings, MD;  Location: [place];  Service: Cardiovascular;  Laterality: N/A;  . TEE WITHOUT CARDIOVERSION N/A 10/26/2014   Procedure: TRANSESOPHAGEAL ECHOCARDIOGRAM (TEE);  Surgeon: Thayer Headings, MD;  Location: [place];  Service: Cardiovascular;  Laterality: N/A;   Social History   Occupational History  . 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 Smoking status: Never Smoker  . Smokeless tobacco: Never Used  Substance and Sexual Activity  . Alcohol use: Yes    Alcohol/week: 0.0 standard drinks    Comment: 1-2 drinks/week  . Drug use: No  . Sexual activity: Not on file

## 2019-04-30 DIAGNOSIS — Z0000 Encounter for general adult medical examination without abnormal findings: Secondary | ICD-10-CM | POA: Diagnosis not present

## 2019-04-30 DIAGNOSIS — E119 Type 2 diabetes mellitus without complications: Secondary | ICD-10-CM | POA: Diagnosis not present

## 2019-04-30 DIAGNOSIS — I10 Essential (primary) hypertension: Secondary | ICD-10-CM | POA: Diagnosis not present

## 2019-04-30 DIAGNOSIS — E039 Hypothyroidism, unspecified: Secondary | ICD-10-CM | POA: Diagnosis not present

## 2019-05-05 DIAGNOSIS — Z0000 Encounter for general adult medical examination without abnormal findings: Secondary | ICD-10-CM | POA: Diagnosis not present

## 2019-05-05 DIAGNOSIS — E039 Hypothyroidism, unspecified: Secondary | ICD-10-CM | POA: Diagnosis not present

## 2019-05-05 DIAGNOSIS — E119 Type 2 diabetes mellitus without complications: Secondary | ICD-10-CM | POA: Diagnosis not present

## 2019-09-09 DIAGNOSIS — U071 COVID-19: Secondary | ICD-10-CM

## 2019-09-09 HISTORY — DX: COVID-19: U07.1

## 2019-09-10 ENCOUNTER — Other Ambulatory Visit: Payer: Self-pay

## 2019-09-10 DIAGNOSIS — Z20822 Contact with and (suspected) exposure to covid-19: Secondary | ICD-10-CM

## 2019-09-11 LAB — NOVEL CORONAVIRUS, NAA: SARS-CoV-2, NAA: DETECTED — AB

## 2019-09-15 ENCOUNTER — Encounter (HOSPITAL_COMMUNITY): Payer: Self-pay

## 2019-09-15 ENCOUNTER — Emergency Department (HOSPITAL_COMMUNITY)
Admission: EM | Admit: 2019-09-15 | Discharge: 2019-09-15 | Disposition: A | Discharge status: Other Acute Inpatient Hospital | Payer: Medicare Other | Source: Home / Self Care

## 2019-09-15 ENCOUNTER — Inpatient Hospital Stay (HOSPITAL_COMMUNITY)
Admission: EM | Admit: 2019-09-15 | Discharge: 2019-09-19 | DRG: 177 | Disposition: A | Discharge status: Home Health | Payer: Medicare Other | Attending: Internal Medicine | Admitting: Internal Medicine

## 2019-09-15 ENCOUNTER — Emergency Department (HOSPITAL_COMMUNITY): Payer: Medicare Other

## 2019-09-15 ENCOUNTER — Other Ambulatory Visit: Payer: Self-pay

## 2019-09-15 DIAGNOSIS — Z79890 Hormone replacement therapy (postmenopausal): Secondary | ICD-10-CM | POA: Diagnosis not present

## 2019-09-15 DIAGNOSIS — Z79899 Other long term (current) drug therapy: Secondary | ICD-10-CM

## 2019-09-15 DIAGNOSIS — Z8041 Family history of malignant neoplasm of ovary: Secondary | ICD-10-CM | POA: Diagnosis not present

## 2019-09-15 DIAGNOSIS — I10 Essential (primary) hypertension: Secondary | ICD-10-CM | POA: Diagnosis not present

## 2019-09-15 DIAGNOSIS — E1165 Type 2 diabetes mellitus with hyperglycemia: Secondary | ICD-10-CM | POA: Diagnosis not present

## 2019-09-15 DIAGNOSIS — I480 Paroxysmal atrial fibrillation: Secondary | ICD-10-CM | POA: Diagnosis not present

## 2019-09-15 DIAGNOSIS — Z8249 Family history of ischemic heart disease and other diseases of the circulatory system: Secondary | ICD-10-CM

## 2019-09-15 DIAGNOSIS — J9601 Acute respiratory failure with hypoxia: Secondary | ICD-10-CM | POA: Diagnosis present

## 2019-09-15 DIAGNOSIS — E1122 Type 2 diabetes mellitus with diabetic chronic kidney disease: Secondary | ICD-10-CM | POA: Diagnosis present

## 2019-09-15 DIAGNOSIS — Z8546 Personal history of malignant neoplasm of prostate: Secondary | ICD-10-CM | POA: Diagnosis not present

## 2019-09-15 DIAGNOSIS — J069 Acute upper respiratory infection, unspecified: Secondary | ICD-10-CM

## 2019-09-15 DIAGNOSIS — N183 Chronic kidney disease, stage 3 unspecified: Secondary | ICD-10-CM | POA: Diagnosis present

## 2019-09-15 DIAGNOSIS — N1831 Chronic kidney disease, stage 3a: Secondary | ICD-10-CM | POA: Diagnosis not present

## 2019-09-15 DIAGNOSIS — K219 Gastro-esophageal reflux disease without esophagitis: Secondary | ICD-10-CM | POA: Diagnosis not present

## 2019-09-15 DIAGNOSIS — U071 COVID-19: Secondary | ICD-10-CM | POA: Diagnosis not present

## 2019-09-15 DIAGNOSIS — Z7984 Long term (current) use of oral hypoglycemic drugs: Secondary | ICD-10-CM

## 2019-09-15 DIAGNOSIS — K760 Fatty (change of) liver, not elsewhere classified: Secondary | ICD-10-CM | POA: Diagnosis not present

## 2019-09-15 DIAGNOSIS — Z20828 Contact with and (suspected) exposure to other viral communicable diseases: Secondary | ICD-10-CM | POA: Diagnosis present

## 2019-09-15 DIAGNOSIS — R531 Weakness: Secondary | ICD-10-CM | POA: Diagnosis not present

## 2019-09-15 DIAGNOSIS — J989 Respiratory disorder, unspecified: Secondary | ICD-10-CM | POA: Diagnosis not present

## 2019-09-15 DIAGNOSIS — Z7901 Long term (current) use of anticoagulants: Secondary | ICD-10-CM

## 2019-09-15 DIAGNOSIS — R0602 Shortness of breath: Secondary | ICD-10-CM | POA: Diagnosis not present

## 2019-09-15 DIAGNOSIS — N179 Acute kidney failure, unspecified: Secondary | ICD-10-CM | POA: Diagnosis present

## 2019-09-15 DIAGNOSIS — M4802 Spinal stenosis, cervical region: Secondary | ICD-10-CM | POA: Diagnosis present

## 2019-09-15 DIAGNOSIS — J1289 Other viral pneumonia: Secondary | ICD-10-CM | POA: Diagnosis not present

## 2019-09-15 DIAGNOSIS — Z923 Personal history of irradiation: Secondary | ICD-10-CM

## 2019-09-15 DIAGNOSIS — I129 Hypertensive chronic kidney disease with stage 1 through stage 4 chronic kidney disease, or unspecified chronic kidney disease: Secondary | ICD-10-CM | POA: Diagnosis present

## 2019-09-15 DIAGNOSIS — M4302 Spondylolysis, cervical region: Secondary | ICD-10-CM | POA: Diagnosis not present

## 2019-09-15 DIAGNOSIS — E785 Hyperlipidemia, unspecified: Secondary | ICD-10-CM | POA: Diagnosis present

## 2019-09-15 DIAGNOSIS — J984 Other disorders of lung: Secondary | ICD-10-CM | POA: Diagnosis not present

## 2019-09-15 DIAGNOSIS — R0902 Hypoxemia: Secondary | ICD-10-CM | POA: Diagnosis not present

## 2019-09-15 DIAGNOSIS — R05 Cough: Secondary | ICD-10-CM | POA: Diagnosis not present

## 2019-09-15 DIAGNOSIS — Z66 Do not resuscitate: Secondary | ICD-10-CM | POA: Diagnosis present

## 2019-09-15 DIAGNOSIS — Z888 Allergy status to other drugs, medicaments and biological substances status: Secondary | ICD-10-CM

## 2019-09-15 LAB — D-DIMER, QUANTITATIVE: D-Dimer, Quant: 1.04 ug/mL-FEU — ABNORMAL HIGH (ref 0.00–0.50)

## 2019-09-15 LAB — CBC WITH DIFFERENTIAL/PLATELET
Abs Immature Granulocytes: 0.07 10*3/uL (ref 0.00–0.07)
Basophils Absolute: 0 10*3/uL (ref 0.0–0.1)
Basophils Relative: 0 %
Eosinophils Absolute: 0 10*3/uL (ref 0.0–0.5)
Eosinophils Relative: 0 %
HCT: 46.3 % (ref 39.0–52.0)
Hemoglobin: 15.5 g/dL (ref 13.0–17.0)
Immature Granulocytes: 1 %
Lymphocytes Relative: 9 %
Lymphs Abs: 0.6 10*3/uL — ABNORMAL LOW (ref 0.7–4.0)
MCH: 31.3 pg (ref 26.0–34.0)
MCHC: 33.5 g/dL (ref 30.0–36.0)
MCV: 93.5 fL (ref 80.0–100.0)
Monocytes Absolute: 0.5 10*3/uL (ref 0.1–1.0)
Monocytes Relative: 7 %
Neutro Abs: 5.9 10*3/uL (ref 1.7–7.7)
Neutrophils Relative %: 83 %
Platelets: 172 10*3/uL (ref 150–400)
RBC: 4.95 MIL/uL (ref 4.22–5.81)
RDW: 12.9 % (ref 11.5–15.5)
WBC: 7.1 10*3/uL (ref 4.0–10.5)
nRBC: 0 % (ref 0.0–0.2)

## 2019-09-15 LAB — COMPREHENSIVE METABOLIC PANEL
ALT: 63 U/L — ABNORMAL HIGH (ref 0–44)
AST: 99 U/L — ABNORMAL HIGH (ref 15–41)
Albumin: 3.3 g/dL — ABNORMAL LOW (ref 3.5–5.0)
Alkaline Phosphatase: 78 U/L (ref 38–126)
Anion gap: 12 (ref 5–15)
BUN: 31 mg/dL — ABNORMAL HIGH (ref 8–23)
CO2: 21 mmol/L — ABNORMAL LOW (ref 22–32)
Calcium: 8.6 mg/dL — ABNORMAL LOW (ref 8.9–10.3)
Chloride: 97 mmol/L — ABNORMAL LOW (ref 98–111)
Creatinine, Ser: 1.6 mg/dL — ABNORMAL HIGH (ref 0.61–1.24)
GFR calc Af Amer: 45 mL/min — ABNORMAL LOW (ref >60)
GFR calc non Af Amer: 39 mL/min — ABNORMAL LOW (ref >60)
Glucose, Bld: 108 mg/dL — ABNORMAL HIGH (ref 70–99)
Potassium: 4.1 mmol/L (ref 3.5–5.1)
Sodium: 130 mmol/L — ABNORMAL LOW (ref 135–145)
Total Bilirubin: 0.9 mg/dL (ref 0.3–1.2)
Total Protein: 7.1 g/dL (ref 6.5–8.1)

## 2019-09-15 LAB — GLUCOSE, CAPILLARY: Glucose-Capillary: 138 mg/dL — ABNORMAL HIGH (ref 70–99)

## 2019-09-15 LAB — FERRITIN: Ferritin: 851 ng/mL — ABNORMAL HIGH (ref 24–336)

## 2019-09-15 LAB — LACTIC ACID, PLASMA: Lactic Acid, Venous: 1.6 mmol/L (ref 0.5–1.9)

## 2019-09-15 LAB — SARS CORONAVIRUS 2 BY RT PCR (HOSPITAL ORDER, PERFORMED IN ~~LOC~~ HOSPITAL LAB): SARS Coronavirus 2: POSITIVE — AB

## 2019-09-15 LAB — TRIGLYCERIDES: Triglycerides: 133 mg/dL (ref <150)

## 2019-09-15 LAB — C-REACTIVE PROTEIN: CRP: 9.2 mg/dL — ABNORMAL HIGH (ref <1.0)

## 2019-09-15 LAB — FIBRINOGEN: Fibrinogen: 666 mg/dL — ABNORMAL HIGH (ref 210–475)

## 2019-09-15 LAB — LACTATE DEHYDROGENASE: LDH: 182 U/L (ref 98–192)

## 2019-09-15 LAB — PROCALCITONIN: Procalcitonin: 0.21 ng/mL

## 2019-09-15 MED ORDER — SODIUM CHLORIDE 0.9% FLUSH: 3.0000 mL | INTRAVENOUS | Status: DC | PRN

## 2019-09-15 MED ORDER — ZINC SULFATE 220 (50 ZN) MG PO CAPS
220.0000 mg | ORAL_CAPSULE | Freq: Every day | ORAL | Status: DC
  Administered 2019-09-15 – 2019-09-19 (×5): 220 mg via ORAL
  Filled 2019-09-15 (×7): qty 1

## 2019-09-15 MED ORDER — VITAMIN C 500 MG PO TABS
500.0000 mg | ORAL_TABLET | Freq: Every day | ORAL | Status: DC
  Administered 2019-09-15 – 2019-09-19 (×5): 500 mg via ORAL
  Filled 2019-09-15 (×7): qty 1

## 2019-09-15 MED ORDER — SODIUM CHLORIDE 0.9% FLUSH
3.0000 mL | Freq: Two times a day (BID) | INTRAVENOUS | Status: DC
  Administered 2019-09-15 – 2019-09-18 (×7): 3 mL via INTRAVENOUS

## 2019-09-15 MED ORDER — ONDANSETRON HCL 4 MG/2ML IJ SOLN: 4.0000 mg | Freq: Four times a day (QID) | INTRAMUSCULAR | Status: DC | PRN

## 2019-09-15 MED ORDER — SODIUM CHLORIDE 0.9% FLUSH
3.0000 mL | Freq: Two times a day (BID) | INTRAVENOUS | Status: DC
  Administered 2019-09-16 – 2019-09-18 (×6): 3 mL via INTRAVENOUS

## 2019-09-15 MED ORDER — HYDROCOD POLST-CPM POLST ER 10-8 MG/5ML PO SUER: 5.0000 mL | Freq: Two times a day (BID) | ORAL | Status: DC | PRN

## 2019-09-15 MED ORDER — SENNOSIDES-DOCUSATE SODIUM 8.6-50 MG PO TABS
1.0000 | ORAL_TABLET | Freq: Two times a day (BID) | ORAL | Status: DC
  Administered 2019-09-15 – 2019-09-19 (×8): 1 via ORAL
  Filled 2019-09-15 (×9): qty 1

## 2019-09-15 MED ORDER — ACETAMINOPHEN 325 MG PO TABS
650.0000 mg | ORAL_TABLET | Freq: Four times a day (QID) | ORAL | Status: DC | PRN
  Administered 2019-09-16 – 2019-09-17 (×2): 650 mg via ORAL
  Filled 2019-09-15 (×2): qty 2

## 2019-09-15 MED ORDER — GUAIFENESIN-DM 100-10 MG/5ML PO SYRP: 10.0000 mL | ORAL_SOLUTION | ORAL | Status: DC | PRN

## 2019-09-15 MED ORDER — ALBUTEROL SULFATE HFA 108 (90 BASE) MCG/ACT IN AERS
4.0000 | INHALATION_SPRAY | RESPIRATORY_TRACT | Status: DC | PRN
  Administered 2019-09-18: 13:00:00 4 via RESPIRATORY_TRACT
  Filled 2019-09-15: qty 6.7

## 2019-09-15 MED ORDER — INSULIN ASPART 100 UNIT/ML ~~LOC~~ SOLN
0.0000 [IU] | Freq: Every day | SUBCUTANEOUS | Status: DC
  Administered 2019-09-16: 3 [IU] via SUBCUTANEOUS
  Administered 2019-09-17 – 2019-09-18 (×2): 2 [IU] via SUBCUTANEOUS
  Filled 2019-09-15: qty 0.05

## 2019-09-15 MED ORDER — GLUCERNA SHAKE PO LIQD
237.0000 mL | Freq: Three times a day (TID) | ORAL | Status: DC
  Administered 2019-09-16 – 2019-09-19 (×9): 237 mL via ORAL
  Filled 2019-09-15 (×15): qty 237

## 2019-09-15 MED ORDER — SODIUM CHLORIDE 0.9 % IV SOLN
100.0000 mg | INTRAVENOUS | Status: AC
  Administered 2019-09-16 – 2019-09-19 (×4): 100 mg via INTRAVENOUS
  Filled 2019-09-15 (×4): qty 20

## 2019-09-15 MED ORDER — INSULIN ASPART 100 UNIT/ML ~~LOC~~ SOLN
0.0000 [IU] | Freq: Three times a day (TID) | SUBCUTANEOUS | Status: DC
  Administered 2019-09-16 (×2): 5 [IU] via SUBCUTANEOUS
  Administered 2019-09-16: 18:00:00 8 [IU] via SUBCUTANEOUS
  Administered 2019-09-17: 09:00:00 5 [IU] via SUBCUTANEOUS
  Administered 2019-09-17: 8 [IU] via SUBCUTANEOUS
  Administered 2019-09-17: 11 [IU] via SUBCUTANEOUS
  Administered 2019-09-18: 13:00:00 8 [IU] via SUBCUTANEOUS
  Administered 2019-09-18: 5 [IU] via SUBCUTANEOUS
  Administered 2019-09-18: 8 [IU] via SUBCUTANEOUS
  Administered 2019-09-19: 5 [IU] via SUBCUTANEOUS
  Administered 2019-09-19: 13:00:00 15 [IU] via SUBCUTANEOUS
  Filled 2019-09-15: qty 0.15

## 2019-09-15 MED ORDER — ALBUTEROL SULFATE HFA 108 (90 BASE) MCG/ACT IN AERS
2.0000 | INHALATION_SPRAY | Freq: Four times a day (QID) | RESPIRATORY_TRACT | Status: DC
  Administered 2019-09-16 – 2019-09-19 (×13): 2 via RESPIRATORY_TRACT
  Filled 2019-09-15: qty 6.7

## 2019-09-15 MED ORDER — SODIUM CHLORIDE 0.9 % IV SOLN: 250.0000 mL | INTRAVENOUS | Status: DC | PRN

## 2019-09-15 MED ORDER — SODIUM CHLORIDE 0.9 % IV SOLN
200.0000 mg | Freq: Once | INTRAVENOUS | Status: AC
  Administered 2019-09-15: 200 mg via INTRAVENOUS
  Filled 2019-09-15: qty 40

## 2019-09-15 MED ORDER — DEXAMETHASONE SODIUM PHOSPHATE 10 MG/ML IJ SOLN
6.0000 mg | Freq: Every day | INTRAMUSCULAR | Status: DC
  Administered 2019-09-15: 6 mg via INTRAVENOUS
  Filled 2019-09-15: qty 1

## 2019-09-15 MED ORDER — ONDANSETRON HCL 4 MG PO TABS: 4.0000 mg | ORAL_TABLET | Freq: Four times a day (QID) | ORAL | Status: DC | PRN
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
## 2019-09-15 NOTE — ED Notes (Signed)
Linens changed, personal items placed in a belonging bag, sandwich and soda provided, pt assisted with using the urinal.

## 2019-09-15 NOTE — ED Notes (Signed)
Spoke with daughter and provided her with the contact information for nurse, room number and contact information at [facility].

## 2019-09-15 NOTE — Progress Notes (Signed)
Pharmacy: Remdesivir   Patient is a 85 y.o. M with COVID.  Pharmacy has been consulted for remdesivir dosing.   - ALT: 63 - CXR shows: Chronic left basilar scarring.  Lungs appear otherwise clear - Pt is requiring supplemental oxygen: 3L Man> 94%    A/P:  - Patient meets criteria for remdesivir. Will initiate remdesivir 200 mg once followed by 100 mg daily x 4 days.  - Daily CMET while on remdesivir - Will f/u pt's ALT and clinical condition  Eudelia Bunch, Pharm.D [phone] 09/15/2019 4:11 PM

## 2019-09-15 NOTE — ED Notes (Signed)
Daughter called and voicemail left to notify her of [facility], contact [phone].

## 2019-09-15 NOTE — ED Triage Notes (Addendum)
Patient arrived via [facility] from home. Patient is AOx4 and Ambulatory. Patient was tested or resulted positive for COVID 19 on October 1st. Since then patient has had weakness. Patient denies fever, pain, dyspnea at rest or with exertion, chest pain or any SOB. Wife is currently in [facility] facility at this time.  Patient is currently on 3L nasal Canula and at 94%.   On room air patient is at 90%

## 2019-09-15 NOTE — ED Notes (Addendum)
Daughter Mickel Baas [phone] called to notify her that [facility] is here to transport pt to [facility].

## 2019-09-15 NOTE — ED Provider Notes (Signed)
[facility] DEPT Provider Note   CSN: [id number] Arrival date & time: 09/15/19  1032     History   Chief Complaint Chief Complaint  Patient presents with  . COVID +  . Fatigue    HPI Charles Boyer is a 85 y.o. male.     The history is provided by the patient and medical records. No language interpreter was used.     85 year old male brought here via EMS from home for further management soft recently diagnosed COVID-19.  Patient is a poor historian.  Patient has been feeling bad for the past 12 days.  He endorsed generalized body aches, decrease in appetite, not feeling well along with persistent fatigue since.  He was seen at [facility] a week ago and was test positive for COVID-19.  He was giving supportive medication but states that it does not provide any improvement.  He does not complain of any associated fever, productive cough, nausea vomiting diarrhea or dysuria.  When EMS arrived, patient was found to be at 90% on room air which improved to 93% on 3 L of nasal cannula.  Patient mention wife is now having similar symptoms.  He did not recall any recent sick contact and denies any recent travel.  Past Medical History:  Diagnosis Date  . Cervical spondylosis 10/25/2014   CT C- spine 10/24/14:Cervical spondylosis causing osseous foraminal stenosis at several levels. No acute cervical spine findings.  . Dyslipidemia   . GERD (gastroesophageal reflux disease)   . Hiatal hernia   . Hypertension 01/16/11   ECHOCARDIOGRAM-NORMAL EF>55% STRESS  MYOCARDIAL PERFUSION 06/29/08 NORMAL.  . Lab test positive for detection of COVID-19 virus 09/09/2019  . Paroxysmal atrial flutter ([place])   . Prostate cancer ([place]) 2001   rx w/ seed implantation  . Type 2 diabetes mellitus ([place])    Diagnosed 10/25/14   . Type II diabetes mellitus ([place])    TYPE 2    Patient Active Problem List   Diagnosis Date Noted  . Hyperlipidemia 12/15/2014  . HTN  (hypertension) 10/26/2014  . Leukocytosis 10/25/2014  . Hyponatremia 10/25/2014  . Type 2 diabetes mellitus without complications ([place]) [id number]  . Hepatic steatosis 10/25/2014  . Cervical spondylosis 10/25/2014  . Atrial fibrillation with rapid ventricular response ([place]) 10/24/2014    Past Surgical History:  Procedure Laterality Date  . CARDIOVERSION N/A 10/26/2014   Procedure: CARDIOVERSION;  Surgeon: Thayer Headings, MD;  Location: [facility];  Service: Cardiovascular;  Laterality: N/A;  . [place]  2001  . TEE WITHOUT CARDIOVERSION N/A 10/26/2014   Procedure: TRANSESOPHAGEAL ECHOCARDIOGRAM (TEE);  Surgeon: Thayer Headings, MD;  Location: [facility];  Service: Cardiovascular;  Laterality: N/A;  . TEE WITHOUT CARDIOVERSION N/A 10/26/2014   Procedure: TRANSESOPHAGEAL ECHOCARDIOGRAM (TEE);  Surgeon: Thayer Headings, MD;  Location: [facility];  Service: Cardiovascular;  Laterality: N/A;        Home Medications    Prior to Admission medications   Medication Sig Start Date End Date Taking? Authorizing Provider  colchicine 0.6 MG tablet Take 0.6 mg by mouth as needed.    [provider]  ELIQUIS 2.5 MG TABS tablet TAKE 1 TABLET (2.5 MG TOTAL) BY MOUTH 2 (TWO) TIMES DAILY. 02/05/16   Lorretta Harp, MD  l-methylfolate-B6-B12 (METANX) 3-35-2 MG TABS Take 1 tablet by mouth 2 (two) times daily.    [provider]  levothyroxine (SYNTHROID, LEVOTHROID) 25 MCG tablet Take 25 mcg by mouth See  admin instructions. Pharmacy states they have not filled medication since 2014 however patient states he is taking this on sundays only per patient    [provider]  MATZIM LA 240 MG 24 hr tablet TAKE 1 TABLET (240 MG TOTAL) BY MOUTH DAILY. 03/06/16   Berry, Jonathan J, MD  metFORMIN (GLUCOPHAGE) 500 MG tablet Take 500 mg by mouth 2 (two) times daily with a meal.    [provider]  pantoprazole (PROTONIX) 40 MG tablet TAKE 1 TABLET BY  MOUTH TWICE A DAY 05/08/16   Berry, Jonathan J, MD  pioglitazone (ACTOS) 30 MG tablet Take 30 mg by mouth daily. 10/28/14   [provider]  pravastatin (PRAVACHOL) 20 MG tablet TAKE 1 TABLET (20 MG TOTAL) BY MOUTH DAILY. 02/01/16   Berry, Jonathan J, MD    Family History Family History  Problem Relation Age of Onset  . Cancer - Ovarian Other   . Heart attack Other        70 's    Social History Social History   Tobacco Use  . Smoking status: Never Smoker  . Smokeless tobacco: Never Used  Substance Use Topics  . Alcohol use: Yes    Alcohol/week: 0.0 standard drinks    Comment: 1-2 drinks/week  . Drug use: No     Allergies   Byetta 10 mcg pen [exenatide], Prednisone, Corticosteroids, and Nitroglycerin   Review of Systems Review of Systems  All other systems reviewed and are negative.    Physical Exam Updated Vital Signs BP 114/64 (BP Location: Right Arm)   Pulse 76   Temp 98.5 F (36.9 C) (Oral)   Resp (!) 25   Ht 5\' 9"  (1.753 m)   Wt 73.2 kg   SpO2 94%   BMI 23.83 kg/m   Physical Exam Vitals signs and nursing note reviewed.  Constitutional:      General: He is not in acute distress.    Appearance: He is well-developed.  HENT:     Head: Atraumatic.  Eyes:     Conjunctiva/sclera: Conjunctivae normal.  Neck:     Musculoskeletal: Neck supple.  Pulmonary:     Breath sounds: Rales present.  Skin:    Findings: No rash.  Neurological:     Mental Status: He is alert.     Comments: Patient is oriented x2.  Able to move all 4 extremities equal with normal grip strength.       ED Treatments / Results  Labs (all labs ordered are listed, but only abnormal results are displayed) Labs Reviewed  SARS CORONAVIRUS 2 ([facility]) - Abnormal; Notable for the following components:      Result Value   SARS Coronavirus 2 POSITIVE (*)    All other components within normal limits  CBC WITH DIFFERENTIAL/PLATELET -  Abnormal; Notable for the following components:   Lymphs Abs 0.6 (*)    All other components within normal limits  COMPREHENSIVE METABOLIC PANEL - Abnormal; Notable for the following components:   Sodium 130 (*)    Chloride 97 (*)    CO2 21 (*)    Glucose, Bld 108 (*)    BUN 31 (*)    Creatinine, Ser 1.60 (*)    Calcium 8.6 (*)    Albumin 3.3 (*)    AST 99 (*)    ALT 63 (*)    GFR calc non Af Amer 39 (*)    GFR calc Af Amer 45 (*)  All other components within normal limits  D-DIMER, QUANTITATIVE (NOT AT [facility]) - Abnormal; Notable for the following components:   D-Dimer, Quant 1.04 (*)    All other components within normal limits  FERRITIN - Abnormal; Notable for the following components:   Ferritin 851 (*)    All other components within normal limits  FIBRINOGEN - Abnormal; Notable for the following components:   Fibrinogen 666 (*)    All other components within normal limits  C-REACTIVE PROTEIN - Abnormal; Notable for the following components:   CRP 9.2 (*)    All other components within normal limits  CULTURE, BLOOD (ROUTINE X 2)  CULTURE, BLOOD (ROUTINE X 2)  LACTIC ACID, PLASMA  PROCALCITONIN  LACTATE DEHYDROGENASE  TRIGLYCERIDES  LACTIC ACID, PLASMA    EKG EKG Interpretation  Date/Time:  Wednesday September 15 2019 11:01:19 EDT Ventricular Rate:  83 PR Interval:    QRS Duration: 94 QT Interval:  395 QTC Calculation: 421 R Axis:   -174 Text Interpretation:  Right and left arm electrode reversal, interpretation assumes no reversal Sinus or ectopic atrial rhythm Supraventricular bigeminy Right axis deviation Confirmed by Virgel Manifold [phone]) on 09/15/2019 12:40:04 PM   Radiology Dg Chest Portable 1 View  Result Date: 09/15/2019 CLINICAL DATA:  COVID-19 positive.  Weakness EXAM: PORTABLE CHEST 1 VIEW COMPARISON:  01/24/2016 FINDINGS: The heart size and mediastinal contours are within normal limits. Chronic left basilar scarring, unchanged from prior. No new  focal airspace consolidation. No pleural effusion. No pneumothorax. No acute osseous findings. IMPRESSION: Chronic left basilar scarring.  Lungs appear otherwise clear. Electronically Signed   By: Davina Poke M.D.   On: 09/15/2019 11:52    Procedures Procedures (including critical care time)  Medications Ordered in ED Medications  albuterol (VENTOLIN HFA) 108 (90 Base) MCG/ACT inhaler 4 puff (has no administration in time range)     Initial Impression / Assessment and Plan / ED Course  I have reviewed the triage vital signs and the nursing notes.  Pertinent labs & imaging results that were available during my care of the patient were reviewed by me and considered in my medical decision making (see chart for details).        BP 114/64 (BP Location: Right Arm)   Pulse 76   Temp 98.5 F (36.9 C) (Oral)   Resp (!) 25   Ht 5\' 9"  (1.753 m)   Wt 73.2 kg   SpO2 94%   BMI 23.83 kg/m    Final Clinical Impressions(s) / ED Diagnoses   Final diagnoses:  Acute respiratory disease due to COVID-19 virus    ED Discharge Orders    None     10:33 AM Patient report he was diagnosed with COVID-19 approximately a week ago but still having persistent fatigue and not feeling well.  Also report wife is having similar symptoms.  He has a document O2 of 93% on 3 L.  Patient would likely benefit from hospital admission for further management of his condition.  Work-up initiated.  Izetta Dakin was evaluated in Emergency Department on 09/15/2019 for the symptoms described in the history of present illness. He was evaluated in the context of the global COVID-19 pandemic, which necessitated consideration that the patient might be at risk for infection with the SARS-CoV-2 virus that causes COVID-19. Institutional protocols and algorithms that pertain to the evaluation of patients at risk for COVID-19 are in a state of rapid change based on information released by regulatory bodies including the CDC  and federal and state organizations. These policies and algorithms were followed during the patient's care in the ED.  2:27 PM Labs remarkable for elevation in some inflammatory markers such as elevated d-dimer of 1.04, ferritin of 851, fibrinogen of 666, CRP of 9.2.  Chest x-ray without evidence of pneumonia.  EKG unremarkable.  Patient is currently receiving supplemental oxygen and satting around 90 to 91%.  He also received albuterol inhaler with some improvement.  Patient will need to be admitted for acute respiratory discomfort secondary to COVID-19.  Care discussed with DR. Kohut.   2:40 PM Appreciate consultation from hospitalist, Dr. Posey Pronto, who agrees to see and admit patient to [facility] for further management of his infection.   Domenic Moras, PA-C 09/15/19 1441    Virgel Manifold, MD 09/16/19 [phone]

## 2019-09-15 NOTE — ED Notes (Addendum)
[facility] called unable to give ETA for transport

## 2019-09-15 NOTE — H&P (Signed)
[facility] History and Physical   Patient: Charles Boyer [id number]   PCP: Jani Gravel, MD DOB: [birth year]   DOA: 09/15/2019   DOS: 09/15/2019   DOS: the patient was seen and examined on 09/15/2019  Patient coming from: The patient is coming from Home  Chief Complaint: Shortness of breath and cough  HPI: Charles Boyer is a 85 y.o. male with Past medical history of GERD, HTN, type II DM, prostate cancer, type II DM. Patient reportedly started having complaints of shortness of breath and fatigue around 09/05/2019.  His symptoms were progressively worsening and therefore he initially actually was brought to the hospital since the patient was unable to eat or drink anything and his continues to have generalized progressive weakness pretension patient actually reportedly had a near syncopal episode on 09/06/2019 per daughter. Patient went to see PCP outpatient later on and had positive COVID test on 09/10/2019. After finding out positive COVID diagnosis patient was prescribed hydroxychloroquine by speak to [email or web address]. Due to persistent worsening shortness of breath and symptoms and poor p.o. intake patient was brought to the hospital today. At the time of my evaluation patient denies any acute complaint of chest pain abdominal pain nausea or vomiting fever or chills no acute diarrhea reported. Patient has some mild shortness of breath and mild fatigue.  ED Course: Presented with the shortness of breath.  Oxygenation dropped down to high 80s low 90s.  Patient was placed on oxygen and on 4 L is able to maintain 94% saturation.  COVID-19 was positive again.  Patient was referred for admission.  At his baseline ambulates with assistance independent for most of his ADL;  manages his medication on his own.  Review of Systems: as mentioned in the history of present illness.  All other systems reviewed and are negative.  Past Medical History:  Diagnosis Date   Cervical spondylosis 10/25/2014   CT  C- spine 10/24/14:Cervical spondylosis causing osseous foraminal stenosis at several levels. No acute cervical spine findings.   Dyslipidemia    GERD (gastroesophageal reflux disease)    Hiatal hernia    Hypertension 01/16/11   ECHOCARDIOGRAM-NORMAL EF>55% STRESS  MYOCARDIAL PERFUSION 06/29/08 NORMAL.   Lab test positive for detection of COVID-19 virus 09/09/2019   Paroxysmal atrial flutter ([place])    Prostate cancer ([place]) 2001   rx w/ seed implantation   Type 2 diabetes mellitus ([place])    Diagnosed 10/25/14    Type II diabetes mellitus ([place])    TYPE 2   Past Surgical History:  Procedure Laterality Date   CARDIOVERSION N/A 10/26/2014   Procedure: CARDIOVERSION;  Surgeon: Thayer Headings, MD;  [facility];  Service: Cardiovascular;  Laterality: N/A;   [place]  2001   TEE WITHOUT CARDIOVERSION N/A 10/26/2014   Procedure: TRANSESOPHAGEAL ECHOCARDIOGRAM (TEE);  Surgeon: Thayer Headings, MD;  Location: [place] [facility];  Service: Cardiovascular;  Laterality: N/A;   TEE WITHOUT CARDIOVERSION N/A 10/26/2014   Procedure: TRANSESOPHAGEAL ECHOCARDIOGRAM (TEE);  Surgeon: Thayer Headings, MD;  Location: [place];  Service: Cardiovascular;  Laterality: N/A;   Social History:  reports that he has never smoked. He has never used smokeless tobacco. He reports current alcohol use. He reports that he does not use drugs.  Allergies  Allergen Reactions   Byetta 10 Mcg Pen [Exenatide]    Prednisone Other (See Comments)    MEMORY ISSUES. HALLUCINATIONS.   Corticosteroids Other (See Comments)    HALLUCINATIONS, MEMORY ISSUES   Nitroglycerin  Hypotension, sinus bradycardia and syncope after sublingual Notroglycerin   Family history reviewed and not pertinent Family History  Problem Relation Age of Onset   Cancer - Ovarian Other    Heart attack Other        70's     Prior to Admission medications   Medication Sig Start Date End Date  Taking? Authorizing Provider  colchicine 0.6 MG tablet Take 0.6 mg by mouth as needed (gout).    Yes [provider]  ELIQUIS 2.5 MG TABS tablet TAKE 1 TABLET (2.5 MG TOTAL) BY MOUTH 2 (TWO) TIMES DAILY. Patient taking differently: Take 2.5 mg by mouth 2 (two) times daily.  02/05/16  Yes Lorretta Harp, MD  l-methylfolate-B6-B12 (METANX) 3-35-2 MG TABS Take 1 tablet by mouth 2 (two) times daily.   Yes [provider]  levothyroxine (SYNTHROID, LEVOTHROID) 25 MCG tablet Take 25 mcg by mouth See admin instructions. Pharmacy states they have not filled medication since 2014 however patient states he is taking this on sundays only per patient   Yes [provider]  MATZIM LA 240 MG 24 hr tablet TAKE 1 TABLET (240 MG TOTAL) BY MOUTH DAILY. Patient taking differently: Take 240 mg by mouth daily.  03/06/16  Yes Lorretta Harp, MD  metFORMIN (GLUCOPHAGE) 500 MG tablet Take 500 mg by mouth 2 (two) times daily with a meal.   Yes [provider]  pantoprazole (PROTONIX) 40 MG tablet TAKE 1 TABLET BY MOUTH TWICE A DAY Patient taking differently: Take 40 mg by mouth 2 (two) times daily.  05/08/16  Yes Lorretta Harp, MD  pioglitazone (ACTOS) 30 MG tablet Take 30 mg by mouth daily. 10/28/14  Yes [provider]  pravastatin (PRAVACHOL) 20 MG tablet TAKE 1 TABLET (20 MG TOTAL) BY MOUTH DAILY. Patient taking differently: Take 20 mg by mouth daily.  02/01/16  Yes Lorretta Harp, MD    Physical Exam: Vitals:   09/15/19 1400 09/15/19 1430 09/15/19 1500 09/15/19 1530  BP: 119/74 (!) 108/59 118/66 133/72  Pulse: 70 64 68 70  Resp: (!) 25 (!) 25 (!) 27 (!) 27  Temp:      TempSrc:      SpO2: 91% 91% 92% 93%  Weight:      Height:        General: alert and oriented to time, place, and person. Appear in mild distress, affect appropriate Eyes: PERRL, Conjunctiva normal ENT: Oral Mucosa Clear, moist  Neck: difficult to assess  JVD, no Abnormal Mass Or  lumps Cardiovascular: S1 and S2 Present, no Murmur, peripheral pulses symmetrical Respiratory: good respiratory effort, Bilateral Air entry equal and Decreased, no signs of accessory muscle use, bilateral  Crackles, no wheezes Abdomen: Bowel Sound present, Soft and no tenderness, no hernia Skin: no rashes  Extremities: no Pedal edema, no calf tenderness Neurologic: without any new focal findings Gait not checked due to patient safety concerns  Data Reviewed: I have personally reviewed and interpreted labs, imaging as discussed below.  CBC: Recent Labs  Lab 09/15/19 1130  WBC 7.1  NEUTROABS 5.9  HGB 15.5  HCT 46.3  MCV 93.5  PLT [id number]   Basic Metabolic Panel: Recent Labs  Lab 09/15/19 1130  NA 130*  K 4.1  CL 97*  CO2 21*  GLUCOSE 108*  BUN 31*  CREATININE 1.60*  CALCIUM 8.6*   GFR: Estimated Creatinine Clearance: 33.8 mL/min (A) (by C-G formula based on SCr of 1.6 mg/dL (H)). Liver Function Tests: Recent  Labs  Lab 09/15/19 1130  AST 99*  ALT 63*  ALKPHOS 78  BILITOT 0.9  PROT 7.1  ALBUMIN 3.3*   No results for input(s): LIPASE, AMYLASE in the last 168 hours. No results for input(s): AMMONIA in the last 168 hours. Coagulation Profile: No results for input(s): INR, PROTIME in the last 168 hours. Cardiac Enzymes: No results for input(s): CKTOTAL, CKMB, CKMBINDEX, TROPONINI in the last 168 hours. BNP (last 3 results) No results for input(s): PROBNP in the last 8760 hours. HbA1C: No results for input(s): HGBA1C in the last 72 hours. CBG: No results for input(s): GLUCAP in the last 168 hours. Lipid Profile: Recent Labs    09/15/19 1130  TRIG 133   Thyroid Function Tests: No results for input(s): TSH, T4TOTAL, FREET4, T3FREE, THYROIDAB in the last 72 hours. Anemia Panel: Recent Labs    09/15/19 1130  FERRITIN 851*   Urine analysis:    Component Value Date/Time   COLORURINE YELLOW 10/25/2014 1747   APPEARANCEUR CLEAR 10/25/2014 1747   LABSPEC  <1.005 (L) 10/25/2014 1747   PHURINE 6.0 10/25/2014 1747   GLUCOSEU NEGATIVE 10/25/2014 1747   HGBUR TRACE (A) 10/25/2014 1747   BILIRUBINUR NEGATIVE 10/25/2014 1747   KETONESUR NEGATIVE 10/25/2014 1747   PROTEINUR NEGATIVE 10/25/2014 1747   UROBILINOGEN 0.2 10/25/2014 1747   NITRITE NEGATIVE 10/25/2014 1747   LEUKOCYTESUR NEGATIVE 10/25/2014 1747    Radiological Exams on Admission: Dg Chest Portable 1 View  Result Date: 09/15/2019 CLINICAL DATA:  COVID-19 positive.  Weakness EXAM: PORTABLE CHEST 1 VIEW COMPARISON:  01/24/2016 FINDINGS: The heart size and mediastinal contours are within normal limits. Chronic left basilar scarring, unchanged from prior. No new focal airspace consolidation. No pleural effusion. No pneumothorax. No acute osseous findings. IMPRESSION: Chronic left basilar scarring.  Lungs appear otherwise clear. Electronically Signed   By: Davina Poke M.D.   On: 09/15/2019 11:52   I reviewed all nursing notes, pharmacy notes, vitals, pertinent old records.  Assessment/Plan 1. Acute COVID-19 Viral illness Lab Results  Component Value Date   SARSCOV2NAA POSITIVE (A) 09/15/2019   SARSCOV2NAA Detected (A) 09/10/2019   CXR: Mild hazy bilateral peripheral opacities  Recent Labs    09/15/19 1130  DDIMER 1.04*  FERRITIN 851*  LDH 182  CRP 9.2*    Tmax last 24 hours:  Temp (24hrs), Avg:98.5 F (36.9 C), Min:98.5 F (36.9 C), Max:98.5 F (36.9 C)   Oxygen requirements: On 4 L of oxygen   Antibiotics: None Diuretics: None Vitamin C and Zinc: Started on 09/15/2019 DVT Prophylaxis: Subcutaneous Heparin   Remdesivir: Started on 09/15/2019 Steroids: Started on 09/15/2019 Actemra: Currently holding off on Actemra.  Discussed with daughter regarding possible use of this down the road.  Prone positioning: Patient encouraged to stay in prone position as much as possible.  Patient was on hydroxychloroquine at home prescribed from online platform.  At present we  will discontinue hydroxychloroquine.  This recommendation was clearly discussed with patient's daughter on the phone.  PPE During this encounter: Patient Isolation: Airborne + Droplet + Contact HCP PPE: CAPR, gown. gloves Patient PPE: None  The treatment plan and use of medications and known side effects were discussed with patient/family. It was clearly explained that there is no proven definitive treatment for COVID-19 infection yet. Any medications used here are based on case reports/anecdotal data which are not peer-reviewed and has not been studied using randomized control trials.  Complete risks and long-term side effects are unknown, however in the best clinical  judgment they seem to be of some clinical benefit rather than medical risks.  Patient/family agree with the treatment plan and want to receive these treatments as indicated.    2.  Type 2 diabetes mellitus. Uncontrolled on multiple medications at home. Will get uncontrolled further secondary to steroids. We will continue with IV Decadron to treat his COVID-19. On 3 times daily moderate sliding scale as well as may require scheduled long-acting insulin. Holding metformin and Actos for now. Glucerna.  3.  Acute kidney injury on chronic kidney disease.  Stage III Baseline serum creatinine around 1.3. Now creatinine is 1.6.  Monitor.  4. Elevated LFT. Likely secondary to COVID. Need to monitor closely while the patient is on Remdesivir. Currently holding IV hydration for now.  5.  Hyperlipidemia. Currently holding her statin in the setting of elevated LFT.  Nutrition: Carb modified diet DVT Prophylaxis: Subcutaneous Heparin   Advance goals of care discussion: Full code   Consults: nonoe   Family Communication: no family was present at bedside, at the time of interview.  Discussed with daughter on the phone, Opportunity was given to ask question and all questions were answered satisfactorily.  Disposition: Admitted as  inpatient, med surge unit. Likely to be discharged home, in 5 days.  I have discussed plan of care as described above with RN and patient/family.  Severity of Illness: The appropriate patient status for this patient is INPATIENT. Inpatient status is judged to be reasonable and necessary in order to provide the required intensity of service to ensure the patient's safety. The patient's presenting symptoms, physical exam findings, and initial radiographic and laboratory data in the context of their chronic comorbidities is felt to place them at high risk for further clinical deterioration. Furthermore, it is not anticipated that the patient will be medically stable for discharge from the hospital within 2 midnights of admission. The following factors support the patient status of inpatient.   " The patient's presenting symptoms include shortness of breath and cough with poor p.o. intake. " The worrisome physical exam findings include hypoxia. " The initial radiographic and laboratory data are worrisome because of COVID 19+, abnormal LFT, AKI. " The chronic co-morbidities include type II DM, hypothyroidism, A. fib, chronic anticoagulation.   * I certify that at the point of admission it is my clinical judgment that the patient will require inpatient hospital care spanning beyond 2 midnights from the point of admission due to high intensity of service, high risk for further deterioration and high frequency of surveillance required.*    Author: Berle Mull, MD [facility] 09/15/2019 7:15 PM   To reach On-call, see care teams to locate the attending and reach out to them via [email or web address]. If 7PM-7AM, please contact night-coverage If you still have difficulty reaching the attending provider, please page the [facility] (Director on Call) for [facility] on amion for assistance.

## 2019-09-15 NOTE — ED Notes (Signed)
RN attempted to call report 2x and was able to speak with 2 people each time however RN continued to get hung up on. RN has informed Agricultural consultant.

## 2019-09-15 NOTE — ED Triage Notes (Signed)
Patient arrived via [facility] from home. Patient is AOx4 and ambulatory. Patient chief complaint is non-productive cough with weakness, no dyspnea however is 90% on room air and currently 93% on 3L [place]. Patient wife is also in [facility] facility.  Patient is COVID + and was tested/resulted on October 1st.

## 2019-09-16 DIAGNOSIS — I480 Paroxysmal atrial fibrillation: Secondary | ICD-10-CM

## 2019-09-16 DIAGNOSIS — E785 Hyperlipidemia, unspecified: Secondary | ICD-10-CM

## 2019-09-16 DIAGNOSIS — I10 Essential (primary) hypertension: Secondary | ICD-10-CM

## 2019-09-16 LAB — CBC WITH DIFFERENTIAL/PLATELET
Abs Immature Granulocytes: 0.08 10*3/uL — ABNORMAL HIGH (ref 0.00–0.07)
Basophils Absolute: 0 10*3/uL (ref 0.0–0.1)
Basophils Relative: 0 %
Eosinophils Absolute: 0 10*3/uL (ref 0.0–0.5)
Eosinophils Relative: 0 %
HCT: 48.8 % (ref 39.0–52.0)
Hemoglobin: 16.6 g/dL (ref 13.0–17.0)
Immature Granulocytes: 1 %
Lymphocytes Relative: 7 %
Lymphs Abs: 0.5 10*3/uL — ABNORMAL LOW (ref 0.7–4.0)
MCH: 32.1 pg (ref 26.0–34.0)
MCHC: 34 g/dL (ref 30.0–36.0)
MCV: 94.4 fL (ref 80.0–100.0)
Monocytes Absolute: 0.2 10*3/uL (ref 0.1–1.0)
Monocytes Relative: 2 %
Neutro Abs: 7 10*3/uL (ref 1.7–7.7)
Neutrophils Relative %: 90 %
Platelets: 156 10*3/uL (ref 150–400)
RBC: 5.17 MIL/uL (ref 4.22–5.81)
RDW: 12.9 % (ref 11.5–15.5)
WBC: 7.8 10*3/uL (ref 4.0–10.5)
nRBC: 0 % (ref 0.0–0.2)

## 2019-09-16 LAB — GLUCOSE, CAPILLARY
Glucose-Capillary: 213 mg/dL — ABNORMAL HIGH (ref 70–99)
Glucose-Capillary: 213 mg/dL — ABNORMAL HIGH (ref 70–99)
Glucose-Capillary: 279 mg/dL — ABNORMAL HIGH (ref 70–99)
Glucose-Capillary: 264 mg/dL — ABNORMAL HIGH (ref 70–99)

## 2019-09-16 LAB — COMPREHENSIVE METABOLIC PANEL
ALT: 71 U/L — ABNORMAL HIGH (ref 0–44)
AST: 116 U/L — ABNORMAL HIGH (ref 15–41)
Albumin: 3.2 g/dL — ABNORMAL LOW (ref 3.5–5.0)
Alkaline Phosphatase: 93 U/L (ref 38–126)
Anion gap: 16 — ABNORMAL HIGH (ref 5–15)
BUN: 30 mg/dL — ABNORMAL HIGH (ref 8–23)
CO2: 21 mmol/L — ABNORMAL LOW (ref 22–32)
Calcium: 8.6 mg/dL — ABNORMAL LOW (ref 8.9–10.3)
Chloride: 94 mmol/L — ABNORMAL LOW (ref 98–111)
Creatinine, Ser: 1.36 mg/dL — ABNORMAL HIGH (ref 0.61–1.24)
GFR calc Af Amer: 55 mL/min — ABNORMAL LOW (ref >60)
GFR calc non Af Amer: 47 mL/min — ABNORMAL LOW (ref >60)
Glucose, Bld: 206 mg/dL — ABNORMAL HIGH (ref 70–99)
Potassium: 3.9 mmol/L (ref 3.5–5.1)
Sodium: 131 mmol/L — ABNORMAL LOW (ref 135–145)
Total Bilirubin: 0.8 mg/dL (ref 0.3–1.2)
Total Protein: 7.2 g/dL (ref 6.5–8.1)

## 2019-09-16 LAB — HEMOGLOBIN A1C
Hgb A1c MFr Bld: 6.7 % — ABNORMAL HIGH (ref 4.8–5.6)
Mean Plasma Glucose: 145.59 mg/dL

## 2019-09-16 LAB — MAGNESIUM: Magnesium: 2.3 mg/dL (ref 1.7–2.4)

## 2019-09-16 LAB — FERRITIN: Ferritin: 1155 ng/mL — ABNORMAL HIGH (ref 24–336)

## 2019-09-16 LAB — C-REACTIVE PROTEIN: CRP: 13.5 mg/dL — ABNORMAL HIGH (ref <1.0)

## 2019-09-16 LAB — D-DIMER, QUANTITATIVE: D-Dimer, Quant: 1.11 ug/mL-FEU — ABNORMAL HIGH (ref 0.00–0.50)

## 2019-09-16 LAB — ABO/RH: ABO/RH(D): A POS

## 2019-09-16 MED ORDER — LEVOTHYROXINE SODIUM 50 MCG PO TABS
25.0000 ug | ORAL_TABLET | ORAL | Status: DC
  Administered 2019-09-19: 06:00:00 25 ug via ORAL
  Filled 2019-09-16: qty 1

## 2019-09-16 MED ORDER — PANTOPRAZOLE SODIUM 40 MG PO TBEC
40.0000 mg | DELAYED_RELEASE_TABLET | Freq: Two times a day (BID) | ORAL | Status: DC
  Administered 2019-09-16 – 2019-09-19 (×7): 40 mg via ORAL
  Filled 2019-09-16 (×8): qty 1

## 2019-09-16 MED ORDER — APIXABAN 2.5 MG PO TABS
2.5000 mg | ORAL_TABLET | Freq: Two times a day (BID) | ORAL | Status: DC
  Administered 2019-09-16 – 2019-09-19 (×7): 2.5 mg via ORAL
  Filled 2019-09-16 (×9): qty 1

## 2019-09-16 MED ORDER — DIPHENHYDRAMINE HCL 50 MG/ML IJ SOLN
25.0000 mg | Freq: Once | INTRAMUSCULAR | Status: AC
  Administered 2019-09-16: 25 mg via INTRAVENOUS
  Filled 2019-09-16 (×2): qty 1

## 2019-09-16 MED ORDER — METHYLPREDNISOLONE SODIUM SUCC 125 MG IJ SOLR
60.0000 mg | Freq: Two times a day (BID) | INTRAMUSCULAR | Status: DC
  Administered 2019-09-16 – 2019-09-18 (×5): 60 mg via INTRAVENOUS
  Filled 2019-09-16 (×6): qty 2

## 2019-09-16 MED ORDER — ACETAMINOPHEN 325 MG PO TABS
650.0000 mg | ORAL_TABLET | Freq: Once | ORAL | Status: AC
  Administered 2019-09-16: 650 mg via ORAL
  Filled 2019-09-16 (×2): qty 2

## 2019-09-16 MED ORDER — PRAVASTATIN SODIUM 10 MG PO TABS: 20.0000 mg | ORAL_TABLET | Freq: Every day | ORAL | Status: DC

## 2019-09-16 MED ORDER — COLCHICINE 0.6 MG PO TABS
0.6000 mg | ORAL_TABLET | ORAL | Status: DC | PRN
  Filled 2019-09-16: qty 1

## 2019-09-16 MED ORDER — DILTIAZEM HCL ER COATED BEADS 240 MG PO TB24
240.0000 mg | ORAL_TABLET | Freq: Every day | ORAL | Status: DC
  Administered 2019-09-16 – 2019-09-17 (×2): 240 mg via ORAL
  Filled 2019-09-16 (×3): qty 1

## 2019-09-16 MED ORDER — FUROSEMIDE 10 MG/ML IJ SOLN
20.0000 mg | Freq: Once | INTRAMUSCULAR | Status: AC
  Administered 2019-09-16: 20 mg via INTRAVENOUS
  Filled 2019-09-16: qty 2

## 2019-09-16 MED ORDER — SODIUM CHLORIDE 0.9% IV SOLUTION
Freq: Once | INTRAVENOUS | Status: AC
  Administered 2019-09-16: 16:00:00 via INTRAVENOUS

## 2019-09-16 NOTE — Plan of Care (Signed)
Pt rested during night without incident.  Pt remains on 4L [place]. Sats 90 to 95%, on higher end when awake.  Monitor showed SR with PACs, occasionally.  Pt denied any pain.    Problem: Clinical Measurements: Goal: Respiratory complications will improve Outcome: Progressing   Problem: Activity: Goal: Risk for activity intolerance will decrease Outcome: Progressing   Problem: Coping: Goal: Level of anxiety will decrease Outcome: Progressing

## 2019-09-16 NOTE — Evaluation (Signed)
Physical Therapy Evaluation Patient Details Name: Charles Boyer MRN: [id number] DOB: 20-Jan-1932 Today's Date: 09/16/2019   History of Present Illness  Patient is a 85 y.o. male with PMHx of DM-2, HTN, PAF-on Eliquis-COVID positive on 10/2-presented with shortness of breath and cough.  Found to have acute hypoxemic respiratory failure in the setting of COVID-19 pneumonia  Clinical Impression  The patien did not  Want to ambulate, stating that he is too cold. Patient requested return to bed so PT Assisted pt. Patient on  2 L [place], SPO2 93%. Pt admitted with above diagnosis. Pt currently with functional limitations due to the deficits listed below (see PT Problem List). Pt will benefit from skilled PT to increase their independence and safety with mobility to allow discharge to the venue listed below.    Patient should progress to return home. Pt. Reports wife is sick at home, daughter is available to assist, per  Patient.    Follow Up Recommendations Home health PT-unless family support is limited, then SNF    Equipment Recommendations  Rolling walker with 5" wheels    Recommendations for Other Services OT consult     Precautions / Restrictions Precautions Precautions: Fall Precaution Comments: monitor sats Restrictions Weight Bearing Restrictions: No      Mobility  Bed Mobility Overal bed mobility: Needs Assistance Bed Mobility: Sit to Supine       Sit to supine: Supervision      Transfers Overall transfer level: Needs assistance Equipment used: 1 person hand held assist Transfers: Sit to/from Stand;Stand Pivot Transfers Sit to Stand: Min guard Stand pivot transfers: Min assist       General transfer comment: HHA to steady to step from recliner to bed, slight imbalance to turn around.  Ambulation/Gait             General Gait Details: nt  Financial trader Rankin (Stroke Patients Only)       Balance Overall balance  assessment: Needs assistance Sitting-balance support: No upper extremity supported;Feet supported Sitting balance-Leahy Scale: Good     Standing balance support: During functional activity;No upper extremity supported Standing balance-Leahy Scale: Poor                               Pertinent Vitals/Pain Pain Assessment: No/denies pain    Home Living Family/patient expects to be discharged to:: Private residence Living Arrangements: Spouse/significant other Available Help at Discharge: Family;Available 24 hours/day Type of Home: House Home Access: Level entry     Home Layout: One level Home Equipment: None      Prior Function Level of Independence: Independent         Comments: recently downsized to smaller home     Hand Dominance        Extremity/Trunk Assessment   Upper Extremity Assessment Upper Extremity Assessment: Generalized weakness    Lower Extremity Assessment Lower Extremity Assessment: Generalized weakness    Cervical / Trunk Assessment Cervical / Trunk Assessment: Normal  Communication   Communication: No difficulties  Cognition Arousal/Alertness: Awake/alert Behavior During Therapy: WFL for tasks assessed/performed;Agitated Overall Cognitive Status: Within Functional Limits for tasks assessed                                 General Comments: states he is too cold  to do any walking. provided warm blankets      General Comments      Exercises     Assessment/Plan    PT Assessment Patient needs continued PT services  PT Problem List Decreased strength;Decreased mobility;Decreased safety awareness;Decreased knowledge of precautions;Decreased activity tolerance;Cardiopulmonary status limiting activity;Decreased balance       PT Treatment Interventions DME instruction;Therapeutic activities;Gait training;Therapeutic exercise;Functional mobility training;Balance training;Patient/family education    PT Goals  (Current goals can be found in the Care Plan section)  Acute Rehab PT Goals Patient Stated Goal: agreed to walk one day when warmed up PT Goal Formulation: With patient Time For Goal Achievement: 09/30/19 Potential to Achieve Goals: Good    Frequency Min 3X/week   Barriers to discharge        Co-evaluation               AM-PAC PT "6 Clicks" Mobility  Outcome Measure Help needed turning from your back to your side while in a flat bed without using bedrails?: None Help needed moving from lying on your back to sitting on the side of a flat bed without using bedrails?: None Help needed moving to and from a bed to a chair (including a wheelchair)?: A Little Help needed standing up from a chair using your arms (e.g., wheelchair or bedside chair)?: A Little Help needed to walk in hospital room?: A Lot Help needed climbing 3-5 steps with a railing? : A Lot 6 Click Score: 18    End of Session Equipment Utilized During Treatment: Oxygen Activity Tolerance: Patient limited by fatigue Patient left: in bed;with bed alarm set;with call bell/phone within reach Nurse Communication: Mobility status PT Visit Diagnosis: Difficulty in walking, not elsewhere classified (R26.2);Unsteadiness on feet (R26.81)    Time: [id number] PT Time Calculation (min) (ACUTE ONLY): 17 min   Charges:   PT Evaluation $PT Eval Low Complexity: [facility] Acute Rehabilitation Services Pager [phone] Office [phone]   Claretha Cooper 09/16/2019, 1:19 PM

## 2019-09-16 NOTE — Progress Notes (Signed)
Spoke with patient's daughter, updated her on plan of care and answered any questions.

## 2019-09-16 NOTE — Progress Notes (Signed)
Patient now on room air for 3 hours, O2 saturation in the upper 90's-100% the entire time. Ambulated patient around his room one time on room air, O2 saturation remained in the 90's with lowest at 94%.

## 2019-09-16 NOTE — TOC Initial Note (Signed)
Transition of Care [facility]) - Initial/Assessment Note    Patient Details  Name: Charles Boyer MRN: [id number] Date of Birth: 1932-07-30  Transition of Care [facility]) CM/SW Contact:    Ninfa Meeker, RN Phone Number: [phone] (working remotely) 09/16/2019, 4:25 PM  Clinical Narrative:    85 yr old male admitted from home, COVID positive. Patient lives home with wife who is ill. Has daughter, will confirm that she will be able to provide assistance at discharge. Case manager will continue to follow for discharge needs.                      Patient Goals and CMS Choice        Expected Discharge Plan and Services: to be determined.                                                Prior Living Arrangements/Services                       Activities of Daily Living Home Assistive Devices/Equipment: Eyeglasses, CBG Meter ADL Screening (condition at time of admission) Patient's cognitive ability adequate to safely complete daily activities?: Yes Is the patient deaf or have difficulty hearing?: No Does the patient have difficulty seeing, even when wearing glasses/contacts?: No Does the patient have difficulty concentrating, remembering, or making decisions?: No Patient able to express need for assistance with ADLs?: Yes Does the patient have difficulty dressing or bathing?: No Independently performs ADLs?: Yes (appropriate for developmental age) Does the patient have difficulty walking or climbing stairs?: Yes(secondary to weakness) Weakness of Legs: Both Weakness of Arms/Hands: None  Permission Sought/Granted                  Emotional Assessment              Admission diagnosis:  Acute respiratory disease due to COVID-19 virus [U07.1, J06.9] Patient Active Problem List   Diagnosis Date Noted  . Acute hypoxemic respiratory failure due to COVID-19 ([place]) 09/15/2019  . Hyperlipidemia 12/15/2014  . HTN (hypertension) 10/26/2014  . Leukocytosis  10/25/2014  . Hyponatremia 10/25/2014  . Type 2 diabetes mellitus without complications ([place]) [id number]  . Hepatic steatosis 10/25/2014  . Cervical spondylosis 10/25/2014  . Atrial fibrillation with rapid ventricular response ([place]) 10/24/2014   PCP:  Jani Gravel, MD Pharmacy:   [id number] - [place], [place] [place] [address] Phone: [phone] Fax: [phone]  [id number] - [place], [place] [place] [address] Phone: [phone] Fax: [phone]     Social Determinants of Health (SDOH) Interventions    Readmission Risk Interventions No flowsheet data found.

## 2019-09-16 NOTE — Progress Notes (Addendum)
PROGRESS NOTE                                                                                                                                                                                                             Patient Demographics:    Charles Boyer, is a 85 y.o. male, DOB - 1932-11-16, [id number]  Outpatient Primary MD for the patient is Charles Gravel, MD   Admit date - 09/15/2019   LOS - 1  Chief Complaint  Patient presents with  . COVID  . Fatigue       Brief Narrative: Patient is a 85 y.o. male with PMHx of DM-2, HTN, PAF-on Eliquis-COVID positive on 10/2-presented with shortness of breath and cough.  Found to have acute hypoxemic respiratory failure in the setting of COVID-19 pneumonia.   Subjective:    Charles Boyer today feels very weak-he was on 6 L of oxygen this morning.  When we changed his O2 improved to his ear lobe-he was just requiring 2 L of oxygen.   Assessment  & Plan :   Acute Hypoxic Resp Failure due to Covid 19 Viral pneumonia: Remains fairly stable-suspect he may have PAD-and hence false positive reading of worsening hypoxemia with the pulse ox on his hands.  His CRP is worsening.  Will change Decadron to IV Solu-Medrol-plan to continue with Remdesivir.  I have obtained consent for 1 unit of convalescent plasma.  Long discussion with patient regarding the off label use of Actemra-since his hypoxia is stable on 2 L-I suspect we can hold off at this point.  However if hypoxemia worsens-he has consented to the off label use of Actemra.  I subsequently spoke to his daughter over the phone-she is agreeable with this strategy as well.  Fever: afebrile  O2 requirements: On 2 L  COVID-19 Labs: Recent Labs    09/15/19 1130 09/16/19 0430  DDIMER 1.04* 1.11*  FERRITIN 851* 1,155*  LDH 182  --   CRP 9.2* 13.5*    Lab Results  Component Value Date   SARSCOV2NAA POSITIVE (A) 09/15/2019   SARSCOV2NAA Detected (A) 09/10/2019     COVID-19 Medications: Steroids: 10/7>> Remdesivir: 10/7>> Actemra: Hold off for now-we will plan on off label use if hypoxemia worsens. Convalescent Plasma: Ordered on 10/8 x 1 Research Studies:N/A  Other medications: Diuretics:Euvolemic-no need for lasix Antibiotics:Not needed  as no evidence of bacterial infection  Prone/Incentive Spirometry: encouraged patient to lie prone for 3-4 hours at a time for a total of 16 hours a day, and to encourage incentive spirometry use 3-4/hour  DVT Prophylaxis  : Eliquis   Transaminitis: Secondary to COVID 19-follow closely.  DM-2: CBGs on the higher side-suspect will worsen as patient will be on steroids-start low-dose Lantus-continue SSI.  Follow and adjust  CBG (last 3)  Recent Labs    09/15/19 2201 09/16/19 0753  GLUCAP 138* 213*    PAF: Sinus rhythm-resume Cardizem and Eliquis  Dyslipidemia: Hold statin due to elevated LFTs  GERD: Continue PPI  History of prostate cancer-stable for follow-up in the outpatient setting  ABG: No results found for: PHART, PCO2ART, PO2ART, HCO3, TCO2, ACIDBASEDEF, O2SAT  Vent Settings: N/A   Condition - Extremely Guarded  Family Communication  :  Daughter updated over the phone  Code Status : DNI (reconfirmed with patient-RN at bedside).  Daughter updated as well.  Diet :  Diet Order            Diet heart healthy/carb modified Room service appropriate? Yes; Fluid consistency: Thin  Diet effective now               Disposition Plan  :  Remain hospitalized  Barriers to discharge: Hypoxemia  Consults  :  None  Procedures  :  None  Antibiotics  :    Anti-infectives (From admission, onward)   Start     Dose/Rate Route Frequency Ordered Stop   09/16/19 1600  remdesivir 100 mg in sodium chloride 0.9 % 250 mL IVPB     100 mg 500 mL/hr over 30 Minutes Intravenous Every 24 hours 09/15/19 2129 09/20/19 1559   09/16/19 0000  remdesivir 200 mg in  sodium chloride 0.9 % 250 mL IVPB     200 mg 500 mL/hr over 30 Minutes Intravenous Once 09/15/19 2129 09/16/19 0751      Inpatient Medications  Scheduled Meds: . albuterol  2 puff Inhalation Q6H  . feeding supplement (GLUCERNA SHAKE)  237 mL Oral TID BM  . insulin aspart  0-15 Units Subcutaneous TID WC  . insulin aspart  0-5 Units Subcutaneous QHS  . methylPREDNISolone (SOLU-MEDROL) injection  60 mg Intravenous Q12H  . senna-docusate  1 tablet Oral BID  . sodium chloride flush  3 mL Intravenous Q12H  . sodium chloride flush  3 mL Intravenous Q12H  . vitamin C  500 mg Oral Daily  . zinc sulfate  220 mg Oral Daily   Continuous Infusions: . sodium chloride    . remdesivir 100 mg in NS 250 mL     PRN Meds:.sodium chloride, acetaminophen, albuterol, chlorpheniramine-HYDROcodone, guaiFENesin-dextromethorphan, ondansetron **OR** ondansetron (ZOFRAN) IV, sodium chloride flush   Time Spent in minutes  35   See all Orders from today for further details   Oren Binet M.D on 09/16/2019 at 10:24 AM  To page go to [email or web address] - use universal password  [facility] -  Office  [phone]    Objective:   Vitals:   09/15/19 2130 09/16/19 0413 09/16/19 0630 09/16/19 0751  BP: (!) 142/75 (!) 144/74  (!) 152/82  Pulse: 86 74 64 67  Resp: 20 17 (!) 26 16  Temp: 98.3 F (36.8 C) 97.9 F (36.6 C)  (!) 97.5 F (36.4 C)  TempSrc: Oral Axillary  Oral  SpO2: 94% 92% (!) 89% 91%  Weight:      Height:  Wt Readings from Last 3 Encounters:  09/15/19 73.2 kg  04/14/19 80.3 kg  01/26/16 80.3 kg     Intake/Output Summary (Last 24 hours) at 09/16/2019 1024 Last data filed at 09/16/2019 0958 Gross per 24 hour  Intake 240 ml  Output 200 ml  Net 40 ml     Physical Exam Gen Exam:Alert awake-not in any distress HEENT:atraumatic, normocephalic Chest: B/L clear to auscultation anteriorly CVS:S1S2 regular Abdomen:soft non tender, non distended Extremities:no edema  Neurology: Non focal Skin: no rash   Data Review:    CBC Recent Labs  Lab 09/15/19 1130 09/16/19 0430  WBC 7.1 7.8  HGB 15.5 16.6  HCT 46.3 48.8  PLT 172 156  MCV 93.5 94.4  MCH 31.3 32.1  MCHC 33.5 34.0  RDW 12.9 12.9  LYMPHSABS 0.6* 0.5*  MONOABS 0.5 0.2  EOSABS 0.0 0.0  BASOSABS 0.0 0.0    Chemistries  Recent Labs  Lab 09/15/19 1130 09/16/19 0430  NA 130* 131*  K 4.1 3.9  CL 97* 94*  CO2 21* 21*  GLUCOSE 108* 206*  BUN 31* 30*  CREATININE 1.60* 1.36*  CALCIUM 8.6* 8.6*  MG  --  2.3  AST 99* 116*  ALT 63* 71*  ALKPHOS 78 93  BILITOT 0.9 0.8   ------------------------------------------------------------------------------------------------------------------ Recent Labs    09/15/19 1130  TRIG 133    Lab Results  Component Value Date   HGBA1C 6.7 (H) 09/16/2019   ------------------------------------------------------------------------------------------------------------------ No results for input(s): TSH, T4TOTAL, T3FREE, THYROIDAB in the last 72 hours.  Invalid input(s): FREET3 ------------------------------------------------------------------------------------------------------------------ Recent Labs    09/15/19 1130 09/16/19 0430  FERRITIN 851* 1,155*    Coagulation profile No results for input(s): INR, PROTIME in the last 168 hours.  Recent Labs    09/15/19 1130 09/16/19 0430  DDIMER 1.04* 1.11*    Cardiac Enzymes No results for input(s): CKMB, TROPONINI, MYOGLOBIN in the last 168 hours.  Invalid input(s): CK ------------------------------------------------------------------------------------------------------------------ No results found for: BNP  Micro Results Recent Results (from the past 240 hour(s))  Novel Coronavirus, NAA ([facility])     Status: Abnormal   Collection Time: 09/10/19 12:00 AM   Specimen: Nasopharyngeal(NP) swabs in vial transport medium   NASOPHARYNGE  TESTING  Result Value Ref Range Status   SARS-CoV-2,  NAA Detected (A) Not Detected Final    Comment: Testing was performed using the cobas(R) SARS-CoV-2 test. This nucleic acid amplification test was developed and its performance characteristics determined by [employer]. Nucleic acid amplification tests include PCR and TMA. This test has not been FDA cleared or approved. This test has been authorized by FDA under an Emergency Use Authorization (EUA). This test is only authorized for the duration of time the declaration that circumstances exist justifying the authorization of the emergency use of in vitro diagnostic tests for detection of SARS-CoV-2 virus and/or diagnosis of COVID-19 infection under section 564(b)(1) of the Act, 21 U.S.C. [id number]) (1), unless the authorization is terminated or revoked sooner. When diagnostic testing is negative, the possibility of a false negative result should be considered in the context of a patient's recent exposures and the presence of clinical signs and symptoms consistent with COVID-19. An individual without symptoms  of COVID-19 and who is not shedding SARS-CoV-2 virus would expect to have a negative (not detected) result in this assay.   SARS Coronavirus 2 [facility] order, Performed in [facility] hospital lab) Nasopharyngeal Nasopharyngeal Swab     Status: Abnormal   Collection Time: 09/15/19 11:29 AM   Specimen:  Nasopharyngeal Swab  Result Value Ref Range Status   SARS Coronavirus 2 POSITIVE (A) NEGATIVE Final    Comment: RESULT CALLED TO, READ BACK BY AND VERIFIED WITH:  Dorothyann Gibbs 09/15/19 JM Performed at [facility], [place] [address] [place], [place] [address]     Radiology Reports Dg Chest Portable 1 View  Result Date: 09/15/2019 CLINICAL DATA:  COVID-19 positive.  Weakness EXAM: PORTABLE CHEST 1 VIEW COMPARISON:  01/24/2016 FINDINGS: The heart size and mediastinal contours are within normal limits. Chronic left basilar scarring, unchanged from prior. No  new focal airspace consolidation. No pleural effusion. No pneumothorax. No acute osseous findings. IMPRESSION: Chronic left basilar scarring.  Lungs appear otherwise clear. Electronically Signed   By: Davina Poke M.D.   On: 09/15/2019 11:52

## 2019-09-17 DIAGNOSIS — J9601 Acute respiratory failure with hypoxia: Secondary | ICD-10-CM

## 2019-09-17 DIAGNOSIS — N1831 Chronic kidney disease, stage 3a: Secondary | ICD-10-CM

## 2019-09-17 LAB — BPAM FFP
Blood Product Expiration Date: 202010091428
ISSUE DATE / TIME: 202010081441
Unit Type and Rh: 6200

## 2019-09-17 LAB — CBC WITH DIFFERENTIAL/PLATELET
Abs Immature Granulocytes: 0.1 10*3/uL — ABNORMAL HIGH (ref 0.00–0.07)
Basophils Absolute: 0 10*3/uL (ref 0.0–0.1)
Basophils Relative: 0 %
Eosinophils Absolute: 0 10*3/uL (ref 0.0–0.5)
Eosinophils Relative: 0 %
HCT: 46.9 % (ref 39.0–52.0)
Hemoglobin: 15.7 g/dL (ref 13.0–17.0)
Immature Granulocytes: 1 %
Lymphocytes Relative: 6 %
Lymphs Abs: 0.6 10*3/uL — ABNORMAL LOW (ref 0.7–4.0)
MCH: 31.8 pg (ref 26.0–34.0)
MCHC: 33.5 g/dL (ref 30.0–36.0)
MCV: 94.9 fL (ref 80.0–100.0)
Monocytes Absolute: 0.3 10*3/uL (ref 0.1–1.0)
Monocytes Relative: 3 %
Neutro Abs: 10.1 10*3/uL — ABNORMAL HIGH (ref 1.7–7.7)
Neutrophils Relative %: 90 %
Platelets: 207 10*3/uL (ref 150–400)
RBC: 4.94 MIL/uL (ref 4.22–5.81)
RDW: 12.9 % (ref 11.5–15.5)
WBC: 11.1 10*3/uL — ABNORMAL HIGH (ref 4.0–10.5)
nRBC: 0 % (ref 0.0–0.2)

## 2019-09-17 LAB — FERRITIN: Ferritin: 756 ng/mL — ABNORMAL HIGH (ref 24–336)

## 2019-09-17 LAB — COMPREHENSIVE METABOLIC PANEL
ALT: 66 U/L — ABNORMAL HIGH (ref 0–44)
AST: 84 U/L — ABNORMAL HIGH (ref 15–41)
Albumin: 3.3 g/dL — ABNORMAL LOW (ref 3.5–5.0)
Alkaline Phosphatase: 93 U/L (ref 38–126)
Anion gap: 18 — ABNORMAL HIGH (ref 5–15)
BUN: 46 mg/dL — ABNORMAL HIGH (ref 8–23)
CO2: 23 mmol/L (ref 22–32)
Calcium: 9.2 mg/dL (ref 8.9–10.3)
Chloride: 93 mmol/L — ABNORMAL LOW (ref 98–111)
Creatinine, Ser: 1.51 mg/dL — ABNORMAL HIGH (ref 0.61–1.24)
GFR calc Af Amer: 48 mL/min — ABNORMAL LOW (ref >60)
GFR calc non Af Amer: 42 mL/min — ABNORMAL LOW (ref >60)
Glucose, Bld: 216 mg/dL — ABNORMAL HIGH (ref 70–99)
Potassium: 3.3 mmol/L — ABNORMAL LOW (ref 3.5–5.1)
Sodium: 134 mmol/L — ABNORMAL LOW (ref 135–145)
Total Bilirubin: 0.6 mg/dL (ref 0.3–1.2)
Total Protein: 7.3 g/dL (ref 6.5–8.1)

## 2019-09-17 LAB — GLUCOSE, CAPILLARY
Glucose-Capillary: 228 mg/dL — ABNORMAL HIGH (ref 70–99)
Glucose-Capillary: 329 mg/dL — ABNORMAL HIGH (ref 70–99)
Glucose-Capillary: 279 mg/dL — ABNORMAL HIGH (ref 70–99)
Glucose-Capillary: 216 mg/dL — ABNORMAL HIGH (ref 70–99)

## 2019-09-17 LAB — PREPARE FRESH FROZEN PLASMA: Unit division: 0

## 2019-09-17 LAB — MAGNESIUM: Magnesium: 2.7 mg/dL — ABNORMAL HIGH (ref 1.7–2.4)

## 2019-09-17 LAB — C-REACTIVE PROTEIN: CRP: 10.7 mg/dL — ABNORMAL HIGH (ref <1.0)

## 2019-09-17 LAB — D-DIMER, QUANTITATIVE: D-Dimer, Quant: 0.51 ug/mL-FEU — ABNORMAL HIGH (ref 0.00–0.50)

## 2019-09-17 LAB — BRAIN NATRIURETIC PEPTIDE: B Natriuretic Peptide: 215.5 pg/mL — ABNORMAL HIGH (ref 0.0–100.0)

## 2019-09-17 MED ORDER — INSULIN GLARGINE 100 UNIT/ML ~~LOC~~ SOLN
12.0000 [IU] | Freq: Every day | SUBCUTANEOUS | Status: DC
  Administered 2019-09-17 – 2019-09-19 (×3): 12 [IU] via SUBCUTANEOUS
  Filled 2019-09-17 (×3): qty 0.12

## 2019-09-17 NOTE — Progress Notes (Signed)
Occupational Therapy Evaluation Patient Details Name: Charles Boyer MRN: [id number] DOB: 30-Jan-1932 Today's Date: 09/17/2019    History of Present Illness Patient is a 85 y.o. male with PMHx of DM-2, HTN, PAF-on Eliquis-COVID positive on 10/2-presented with shortness of breath and cough.  Found to have acute hypoxemic respiratory failure in the setting of COVID-19 pneumonia   Clinical Impression   PTA,  Pt lived at home with his wife, who is also sick with Covid, and was independent with mobility and ADL without an AD. 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 Pt completed ADL session on RA with 2/4 DOE and desat into mid 80s. Pt does not endorse being SOB. Quick rebound into 90s with VC for pursed lip breathing. Pt completed incentive spirometer x 10 pulling 750 ml. Pt is very concerned about his wife and became tearful during session. At this time recommend HHOT follow up to facilitate return to PLOF.     Follow Up Recommendations  Supervision/Assistance - 24 hour;Home health OT(pending progress)    Equipment Recommendations  None recommended by OT    Recommendations for Other Services       Precautions / Restrictions Precautions Precautions: Fall Precaution Comments: monitor sats      Mobility Bed Mobility Overal bed mobility: Modified Independent                Transfers Overall transfer level: Needs assistance Equipment used: Rolling walker (2 wheeled) Transfers: Sit to/from [facility] Sit to Stand: Supervision Stand pivot transfers: Min guard       General transfer comment: initially unsteady but pt states RW helped    Balance Overall balance assessment: Needs assistance   Sitting balance-Leahy Scale: Good     Standing balance support: During functional activity;No upper extremity supported Standing balance-Leahy Scale: Poor                             ADL either performed or assessed with clinical  judgement   ADL Overall ADL's : Needs assistance/impaired     Grooming: Set up;Standing   Upper Body Bathing: Set up;Sitting   Lower Body Bathing: Min guard;Sit to/from stand   Upper Body Dressing : Set up;Sitting   Lower Body Dressing: Minimal assistance Lower Body Dressing Details (indicate cue type and reason): difficulty donning socks/shoes; increaed SOB Toilet Transfer: Min guard;RW   Toileting- Clothing Manipulation and Hygiene: Min guard       Functional mobility during ADLs: Min guard;Rolling walker(RW used dueto unsteadiness)       Vision         Perception     Praxis      Pertinent Vitals/Pain Pain Assessment: No/denies pain     Hand Dominance Right   Extremity/Trunk Assessment Upper Extremity Assessment Upper Extremity Assessment: Generalized weakness   Lower Extremity Assessment Lower Extremity Assessment: Defer to PT evaluation   Cervical / Trunk Assessment Cervical / Trunk Assessment: Normal   Communication Communication Communication: No difficulties   Cognition Arousal/Alertness: Awake/alert Behavior During Therapy: WFL for tasks assessed/performed Overall Cognitive Status: Within Functional Limits for tasks assessed                                     General Comments       Exercises Exercises: Other exercises Other Exercises Other Exercises: incentive spirometer x 10   Shoulder  Instructions      Home Living Family/patient expects to be discharged to:: Private residence Living Arrangements: Spouse/significant other Available Help at Discharge: Family;Available 24 hours/day Type of Home: House Home Access: Level entry     Home Layout: One level     Bathroom Shower/Tub: Tub/shower unit;Walk-in shower   Bathroom Toilet: Standard Bathroom Accessibility: Yes How Accessible: Accessible via wheelchair Home Equipment: Shower seat - built in          Prior Functioning/Environment Level of Independence:  Independent        Comments: recently downsized to smaller home; owns and operates 2 businesses        OT Problem List: Decreased strength;Decreased activity tolerance;Impaired balance (sitting and/or standing);Decreased coordination;Decreased cognition;Decreased safety awareness;Decreased knowledge of use of DME or AE;Decreased knowledge of precautions      OT Treatment/Interventions: Self-care/ADL training;Therapeutic exercise;Neuromuscular education;Energy conservation;DME and/or AE instruction;Therapeutic activities;Patient/family education    OT Goals(Current goals can be found in the care plan section) Acute Rehab OT Goals Patient Stated Goal: to get back to work OT Goal Formulation: With patient Time For Goal Achievement: 10/01/19 Potential to Achieve Goals: Good  OT Frequency: Min 3X/week   Barriers to D/C:            Co-evaluation              AM-PAC OT "6 Clicks" Daily Activity     Outcome Measure Help from another person eating meals?: None Help from another person taking care of personal grooming?: A Little Help from another person toileting, which includes using toliet, bedpan, or urinal?: A Little Help from another person bathing (including washing, rinsing, drying)?: A Little Help from another person to put on and taking off regular upper body clothing?: A Little Help from another person to put on and taking off regular lower body clothing?: A Little 6 Click Score: 19   End of Session Equipment Utilized During Treatment: Rolling walker Nurse Communication: Mobility status  Activity Tolerance: Patient tolerated treatment well Patient left: in chair;with call bell/phone within reach  OT Visit Diagnosis: Unsteadiness on feet (R26.81);Muscle weakness (generalized) (M62.81)                Time: [id number] OT Time Calculation (min): 32 min Charges:  OT General Charges $OT Visit: 1 Visit OT Evaluation $OT Eval Moderate Complexity: 1 Mod OT  Treatments $Self Care/Home Management : 8-22 mins  Maurie Boettcher, OT/L   Acute OT Clinical Specialist [place] Pager [phone] Office [phone]   [facility] 09/17/2019, 2:11 PM

## 2019-09-17 NOTE — Progress Notes (Signed)
PROGRESS NOTE                                                                                                                                                                                                             Patient Demographics:    Charles Boyer, is a 85 y.o. male, DOB - [birth year], [id number]  Outpatient Primary MD for the patient is Jani Gravel, MD   Admit date - 09/15/2019   LOS - 2  Chief Complaint  Patient presents with  . COVID  . Fatigue       Brief Narrative: Patient is a 85 y.o. male with PMHx of DM-2, HTN, PAF-on Eliquis-COVID positive on 10/2-presented with shortness of breath and cough.  Found to have acute hypoxemic respiratory failure in the setting of COVID-19 pneumonia.   Subjective:    [name] today feels much better-he is now on room air.   Assessment  & Plan :   Acute Hypoxic Resp Failure due to Covid 19 Viral pneumonia: Markedly better-on room air.  Continue steroids and Remdesivir.  Patient was transfused 1 unit of convalescent plasma on 10/8.  Do not think we need to consider Actemra given clinical improvement.   Fever: afebrile  O2 requirements: On RA  COVID-19 Labs: Recent Labs    09/15/19 1130 09/16/19 0430 09/17/19 0345  DDIMER 1.04* 1.11* 0.51*  FERRITIN 851* 1,155* 756*  LDH 182  --   --   CRP 9.2* 13.5* 10.7*    Lab Results  Component Value Date   SARSCOV2NAA POSITIVE (A) 09/15/2019   SARSCOV2NAA Detected (A) 09/10/2019     COVID-19 Medications: Steroids: 10/7>> Remdesivir: 10/7>> Actemra: Not indicated Convalescent Plasma: 10/8 x 1 Research Studies:N/A  Other medications: Diuretics:Euvolemic-no need for lasix Antibiotics:Not needed as no evidence of bacterial infection  Prone/Incentive Spirometry: encouraged patient to lie prone for 3-4 hours at a time for a total of 16 hours a day, and to encourage incentive spirometry use 3-4/hour  DVT  Prophylaxis  : Eliquis   Transaminitis: Secondary to COVID 19-improving-continue to follow closely.  DM-2: CBGs on the higher side-start Lantus 12 units-continue SSI.  Follow and adjust.  CBG (last 3)  Recent Labs    09/15/19 2201 09/16/19 0753  GLUCAP 138* 213*    PAF: Sinus rhythm-resume Cardizem and Eliquis  Dyslipidemia: Hold statin due to  elevated LFTs  Chronic kidney stage III: Creatinine remains close to usual baseline-follow periodically.  GERD: Continue PPI  History of prostate cancer-stable for follow-up in the outpatient setting  ABG: No results found for: PHART, PCO2ART, PO2ART, HCO3, TCO2, ACIDBASEDEF, O2SAT  Vent Settings: N/A   Condition -stable  Family Communication  :  Daughter updated over the phone on 10/9  Code Status : DNI (reconfirmed with patient-RN at bedside on 10/8).  Daughter updated on 10/8 as well.  Diet :  Diet Order            Diet heart healthy/carb modified Room service appropriate? Yes; Fluid consistency: Thin  Diet effective now               Disposition Plan  :  Remain hospitalized  Barriers to discharge: Hypoxemia-he needs to complete 5 days of Remdesivir  Consults  :  None  Procedures  :  None  Antibiotics  :    Anti-infectives (From admission, onward)   Start     Dose/Rate Route Frequency Ordered Stop   09/16/19 1600  remdesivir 100 mg in sodium chloride 0.9 % 250 mL IVPB     100 mg 500 mL/hr over 30 Minutes Intravenous Every 24 hours 09/15/19 2129 09/20/19 1559   09/16/19 0000  remdesivir 200 mg in sodium chloride 0.9 % 250 mL IVPB     200 mg 500 mL/hr over 30 Minutes Intravenous Once 09/15/19 2129 09/16/19 0751      Inpatient Medications  Scheduled Meds: . albuterol  2 puff Inhalation Q6H  . apixaban  2.5 mg Oral BID  . diltiazem  240 mg Oral Daily  . feeding supplement (GLUCERNA SHAKE)  237 mL Oral TID BM  . insulin aspart  0-15 Units Subcutaneous TID WC  . insulin aspart  0-5 Units Subcutaneous QHS   . [START ON 09/19/2019] levothyroxine  25 mcg Oral Once per day on Sun  . methylPREDNISolone (SOLU-MEDROL) injection  60 mg Intravenous BID  . pantoprazole  40 mg Oral BID  . senna-docusate  1 tablet Oral BID  . sodium chloride flush  3 mL Intravenous Q12H  . sodium chloride flush  3 mL Intravenous Q12H  . vitamin C  500 mg Oral Daily  . zinc sulfate  220 mg Oral Daily   Continuous Infusions: . sodium chloride    . remdesivir 100 mg in NS 250 mL 100 mg (09/16/19 1611)   PRN Meds:.sodium chloride, acetaminophen, albuterol, chlorpheniramine-HYDROcodone, colchicine, guaiFENesin-dextromethorphan, ondansetron **OR** ondansetron (ZOFRAN) IV, sodium chloride flush   Time Spent in minutes  25   See all Orders from today for further details   Oren Binet M.D on 09/17/2019 at 11:43 AM  To page go to [email or web address] - use universal password  [facility] -  Office  [phone]    Objective:   Vitals:   09/16/19 2013 09/17/19 0330 09/17/19 0800 09/17/19 0818  BP:  (!) 120/56 113/73 (!) 121/56  Pulse:  (!) 54 (!) 51 (!) 50  Resp: 18 20 17 15   Temp: 98.3 F (36.8 C) 98.1 F (36.7 C) 97.7 F (36.5 C) 97.8 F (36.6 C)  TempSrc: Oral Oral Oral Oral  SpO2:  98% 90% 93%  Weight:      Height:        Wt Readings from Last 3 Encounters:  09/15/19 73.2 kg  04/14/19 80.3 kg  01/26/16 80.3 kg     Intake/Output Summary (Last 24 hours) at 09/17/2019 1143 Last data filed at 09/17/2019  0800 Gross per 24 hour  Intake 1700 ml  Output 1050 ml  Net 650 ml     Physical Exam Gen Exam:Alert awake-not in any distress HEENT:atraumatic, normocephalic Chest: B/L clear to auscultation anteriorly CVS:S1S2 regular Abdomen:soft non tender, non distended Extremities:no edema Neurology: Non focal Skin: no rash   Data Review:    CBC Recent Labs  Lab 09/15/19 1130 09/16/19 0430 09/17/19 0345  WBC 7.1 7.8 11.1*  HGB 15.5 16.6 15.7  HCT 46.3 48.8 46.9  PLT 172 156 207  MCV  93.5 94.4 94.9  MCH 31.3 32.1 31.8  MCHC 33.5 34.0 33.5  RDW 12.9 12.9 12.9  LYMPHSABS 0.6* 0.5* 0.6*  MONOABS 0.5 0.2 0.3  EOSABS 0.0 0.0 0.0  BASOSABS 0.0 0.0 0.0    Chemistries  Recent Labs  Lab 09/15/19 1130 09/16/19 0430 09/17/19 0345  NA 130* 131* 134*  K 4.1 3.9 3.3*  CL 97* 94* 93*  CO2 21* 21* 23  GLUCOSE 108* 206* 216*  BUN 31* 30* 46*  CREATININE 1.60* 1.36* 1.51*  CALCIUM 8.6* 8.6* 9.2  MG  --  2.3 2.7*  AST 99* 116* 84*  ALT 63* 71* 66*  ALKPHOS 78 93 93  BILITOT 0.9 0.8 0.6   ------------------------------------------------------------------------------------------------------------------ Recent Labs    09/15/19 1130  TRIG 133    Lab Results  Component Value Date   HGBA1C 6.7 (H) 09/16/2019   ------------------------------------------------------------------------------------------------------------------ No results for input(s): TSH, T4TOTAL, T3FREE, THYROIDAB in the last 72 hours.  Invalid input(s): FREET3 ------------------------------------------------------------------------------------------------------------------ Recent Labs    09/16/19 0430 09/17/19 0345  FERRITIN 1,155* 756*    Coagulation profile No results for input(s): INR, PROTIME in the last 168 hours.  Recent Labs    09/16/19 0430 09/17/19 0345  DDIMER 1.11* 0.51*    Cardiac Enzymes No results for input(s): CKMB, TROPONINI, MYOGLOBIN in the last 168 hours.  Invalid input(s): CK ------------------------------------------------------------------------------------------------------------------    Component Value Date/Time   BNP 215.5 (H) 09/17/2019 0345    Micro Results Recent Results (from the past 240 hour(s))  Novel Coronavirus, NAA ([facility])     Status: Abnormal   Collection Time: 09/10/19 12:00 AM   Specimen: Nasopharyngeal(NP) swabs in vial transport medium   NASOPHARYNGE  TESTING  Result Value Ref Range Status   SARS-CoV-2, NAA Detected (A) Not Detected  Final    Comment: Testing was performed using the cobas(R) SARS-CoV-2 test. This nucleic acid amplification test was developed and its performance characteristics determined by [employer]. Nucleic acid amplification tests include PCR and TMA. This test has not been FDA cleared or approved. This test has been authorized by FDA under an Emergency Use Authorization (EUA). This test is only authorized for the duration of time the declaration that circumstances exist justifying the authorization of the emergency use of in vitro diagnostic tests for detection of SARS-CoV-2 virus and/or diagnosis of COVID-19 infection under section 564(b)(1) of the Act, 21 U.S.C. [id number]) (1), unless the authorization is terminated or revoked sooner. When diagnostic testing is negative, the possibility of a false negative result should be considered in the context of a patient's recent exposures and the presence of clinical signs and symptoms consistent with COVID-19. An individual without symptoms  of COVID-19 and who is not shedding SARS-CoV-2 virus would expect to have a negative (not detected) result in this assay.   Blood Culture (routine x 2)     Status: None (Preliminary result)   Collection Time: 09/15/19 10:55 AM   Specimen: BLOOD  Result  Value Ref Range Status   Specimen Description   Final    BLOOD BLOOD RIGHT FOREARM DISTAL Performed at [facility] [address] [place], [place] [address]    Special Requests   Final    BOTTLES DRAWN AEROBIC AND ANAEROBIC Blood Culture results may not be optimal due to an excessive volume of blood received in culture bottles Performed at [facility] [address], [place], [place] [address]    Culture   Final    NO GROWTH 2 DAYS Performed at [facility] [address], [place], [place] [address]    Report Status PENDING  Incomplete  Blood Culture (routine x 2)     Status: None (Preliminary  result)   Collection Time: 09/15/19 11:13 AM   Specimen: BLOOD  Result Value Ref Range Status   Specimen Description   Final    BLOOD BLOOD RIGHT FOREARM Performed at [facility] [address], [place], [place] [address]    Special Requests   Final    BOTTLES DRAWN AEROBIC AND ANAEROBIC Blood Culture results may not be optimal due to an excessive volume of blood received in culture bottles Performed at [facility] [address], [place], [place] [address]    Culture   Final    NO GROWTH 2 DAYS Performed at [facility] [address] [place], [place] [address]    Report Status PENDING  Incomplete  SARS Coronavirus 2 [facility] order, Performed in [facility] hospital lab) Nasopharyngeal Nasopharyngeal Swab     Status: Abnormal   Collection Time: 09/15/19 11:29 AM   Specimen: Nasopharyngeal Swab  Result Value Ref Range Status   SARS Coronavirus 2 POSITIVE (A) NEGATIVE Final    Comment: RESULT CALLED TO, READ BACK BY AND VERIFIED WITH:  Dorothyann Gibbs 09/15/19 JM Performed at [facility], [place] [address], [place], [place] [address]     Radiology Reports Dg Chest Portable 1 View  Result Date: 09/15/2019 CLINICAL DATA:  COVID-19 positive.  Weakness EXAM: PORTABLE CHEST 1 VIEW COMPARISON:  01/24/2016 FINDINGS: The heart size and mediastinal contours are within normal limits. Chronic left basilar scarring, unchanged from prior. No new focal airspace consolidation. No pleural effusion. No pneumothorax. No acute osseous findings. IMPRESSION: Chronic left basilar scarring.  Lungs appear otherwise clear. Electronically Signed   By: Davina Poke M.D.   On: 09/15/2019 11:52

## 2019-09-17 NOTE — Progress Notes (Addendum)
OT Treatment Note  Pt asked to walk in hall. Returned after pt rested form ADL session. Pt able to ambulate in hallway @ 240 ft using RW with 2 rest breaks. Dest to 85 (earlobe probe) on RA, quick rebound to 90s with pursed lip breathing and standing rest break. Recommend assessing with Nelcor probe to further assess O2 needs.  Will continue to follow. Pt very appreciative.     09/17/19 1419  OT Visit Information  Last OT Received On 09/17/19  Assistance Needed +1  History of Present Illness Patient is a 85 y.o. male with PMHx of DM-2, HTN, PAF-on Eliquis-COVID positive on 10/2-presented with shortness of breath and cough.  Found to have acute hypoxemic respiratory failure in the setting of COVID-19 pneumonia  Precautions  Precautions Fall  Precaution Comments monitor sats  Pain Assessment  Pain Assessment No/denies pain  Cognition  Arousal/Alertness Awake/alert  Behavior During Therapy WFL for tasks assessed/performed  Overall Cognitive Status Within Functional Limits for tasks assessed  ADL  General ADL Comments pt bleeding from groin/scrotal area. States he has had this problem before. calls it "jock itch". Bleeding stopped; mesh underwear used with pad and antifungal applied to area - discussed wiht nsg.   Balance  Standing balance-Leahy Scale Fair  General Comments  General comments (skin integrity, edema, etc.) Ambulated around unti using RW 240 ft on RA. Desat to 60  OT - End of Session  Equipment Utilized During Treatment Rolling walker  Activity Tolerance Patient tolerated treatment well  Patient left in chair;with call bell/phone within reach  Nurse Communication Mobility status  OT Assessment/Plan  OT Plan Discharge plan remains appropriate  OT Visit Diagnosis Unsteadiness on feet (R26.81);Muscle weakness (generalized) (M62.81)  OT Frequency (ACUTE ONLY) Min 3X/week  Follow Up Recommendations Supervision/Assistance - 24 hour;Home health OT  OT Equipment None recommended  by OT  AM-PAC OT "6 Clicks" Daily Activity Outcome Measure (Version 2)  Help from another person eating meals? 4  Help from another person taking care of personal grooming? 3  Help from another person toileting, which includes using toliet, bedpan, or urinal? 3  Help from another person bathing (including washing, rinsing, drying)? 3  Help from another person to put on and taking off regular upper body clothing? 3  Help from another person to put on and taking off regular lower body clothing? 3  6 Click Score 19  OT Goal Progression  Progress towards OT goals Progressing toward goals  Acute Rehab OT Goals  Patient Stated Goal to get back to work  OT Goal Formulation With patient  Time For Goal Achievement 10/01/19  Potential to Achieve Goals Good  ADL Goals  Pt Will Perform Lower Body Bathing with modified independence;sit to/from stand  Pt Will Perform Lower Body Dressing with modified independence;sit to/from stand  Pt Will Transfer to Toilet with modified independence;ambulating  Pt/caregiver will Perform Home Exercise Program Increased strength;With theraband;Independently;With written HEP provided;Both right and left upper extremity  Additional ADL Goal #1 Pt will complete ADL session and mobility for ADL with SpO2 remaining above 88.  OT Time Calculation  OT Start Time (ACUTE ONLY) 1135  OT Stop Time (ACUTE ONLY) 1205  OT Time Calculation (min) 30 min  OT General Charges  $OT Visit 1 Visit  OT Treatments  $Therapeutic Activity 23-36 mins  Maurie Boettcher, OT/L   Acute OT Clinical Specialist [place] Pager [phone] Office [phone]

## 2019-09-17 NOTE — Plan of Care (Signed)

## 2019-09-18 LAB — CBC WITH DIFFERENTIAL/PLATELET
Abs Immature Granulocytes: 0.22 10*3/uL — ABNORMAL HIGH (ref 0.00–0.07)
Basophils Absolute: 0 10*3/uL (ref 0.0–0.1)
Basophils Relative: 0 %
Eosinophils Absolute: 0 10*3/uL (ref 0.0–0.5)
Eosinophils Relative: 0 %
HCT: 41.8 % (ref 39.0–52.0)
Hemoglobin: 14.2 g/dL (ref 13.0–17.0)
Immature Granulocytes: 1 %
Lymphocytes Relative: 3 %
Lymphs Abs: 0.6 10*3/uL — ABNORMAL LOW (ref 0.7–4.0)
MCH: 31.3 pg (ref 26.0–34.0)
MCHC: 34 g/dL (ref 30.0–36.0)
MCV: 92.1 fL (ref 80.0–100.0)
Monocytes Absolute: 0.6 10*3/uL (ref 0.1–1.0)
Monocytes Relative: 3 %
Neutro Abs: 17 10*3/uL — ABNORMAL HIGH (ref 1.7–7.7)
Neutrophils Relative %: 93 %
Platelets: 209 10*3/uL (ref 150–400)
RBC: 4.54 MIL/uL (ref 4.22–5.81)
RDW: 12.7 % (ref 11.5–15.5)
WBC: 18.5 10*3/uL — ABNORMAL HIGH (ref 4.0–10.5)
nRBC: 0 % (ref 0.0–0.2)

## 2019-09-18 LAB — COMPREHENSIVE METABOLIC PANEL
ALT: 58 U/L — ABNORMAL HIGH (ref 0–44)
AST: 66 U/L — ABNORMAL HIGH (ref 15–41)
Albumin: 2.9 g/dL — ABNORMAL LOW (ref 3.5–5.0)
Alkaline Phosphatase: 89 U/L (ref 38–126)
Anion gap: 15 (ref 5–15)
BUN: 53 mg/dL — ABNORMAL HIGH (ref 8–23)
CO2: 20 mmol/L — ABNORMAL LOW (ref 22–32)
Calcium: 9.1 mg/dL (ref 8.9–10.3)
Chloride: 97 mmol/L — ABNORMAL LOW (ref 98–111)
Creatinine, Ser: 1.63 mg/dL — ABNORMAL HIGH (ref 0.61–1.24)
GFR calc Af Amer: 44 mL/min — ABNORMAL LOW (ref >60)
GFR calc non Af Amer: 38 mL/min — ABNORMAL LOW (ref >60)
Glucose, Bld: 205 mg/dL — ABNORMAL HIGH (ref 70–99)
Potassium: 3.6 mmol/L (ref 3.5–5.1)
Sodium: 132 mmol/L — ABNORMAL LOW (ref 135–145)
Total Bilirubin: 0.6 mg/dL (ref 0.3–1.2)
Total Protein: 6.4 g/dL — ABNORMAL LOW (ref 6.5–8.1)

## 2019-09-18 LAB — MAGNESIUM: Magnesium: 2.5 mg/dL — ABNORMAL HIGH (ref 1.7–2.4)

## 2019-09-18 LAB — D-DIMER, QUANTITATIVE: D-Dimer, Quant: 0.5 ug/mL-FEU (ref 0.00–0.50)

## 2019-09-18 LAB — C-REACTIVE PROTEIN: CRP: 6.1 mg/dL — ABNORMAL HIGH (ref <1.0)

## 2019-09-18 LAB — GLUCOSE, CAPILLARY
Glucose-Capillary: 232 mg/dL — ABNORMAL HIGH (ref 70–99)
Glucose-Capillary: 291 mg/dL — ABNORMAL HIGH (ref 70–99)
Glucose-Capillary: 239 mg/dL — ABNORMAL HIGH (ref 70–99)

## 2019-09-18 LAB — FERRITIN: Ferritin: 893 ng/mL — ABNORMAL HIGH (ref 24–336)

## 2019-09-18 MED ORDER — METHYLPREDNISOLONE SODIUM SUCC 40 MG IJ SOLR
40.0000 mg | Freq: Two times a day (BID) | INTRAMUSCULAR | Status: DC
  Administered 2019-09-18: 40 mg via INTRAVENOUS
  Filled 2019-09-18: qty 1

## 2019-09-18 MED ORDER — DILTIAZEM HCL ER COATED BEADS 120 MG PO CP24
240.0000 mg | ORAL_CAPSULE | Freq: Every day | ORAL | Status: DC
  Administered 2019-09-18 – 2019-09-19 (×2): 240 mg via ORAL
  Filled 2019-09-18 (×2): qty 2

## 2019-09-18 NOTE — Progress Notes (Signed)
PROGRESS NOTE                                                                                                                                                                                                             Patient Demographics:    Charles Boyer, is a 85 y.o. male, DOB - [birth year], [id number]  Outpatient Primary MD for the patient is Jani Gravel, MD   Admit date - 09/15/2019   LOS - 3  Chief Complaint  Patient presents with   COVID   Fatigue       Brief Narrative: Patient is a 85 y.o. male with PMHx of DM-2, HTN, PAF-on Eliquis-COVID positive on 10/2-presented with shortness of breath and cough.  Found to have acute hypoxemic respiratory failure in the setting of COVID-19 pneumonia.   Subjective:    Charles Boyer remains on room air-he has no complaints.   Assessment  & Plan :   Acute Hypoxic Resp Failure due to Covid 19 Viral pneumonia: Much improved-has been on room air for the past 2 days.  Continue steroids (decrease Solu-Medrol to 40 twice daily) and Remdesivir.  Patient is s/p convalescent plasma on 10/8   Fever: afebrile  O2 requirements: On RA  COVID-19 Labs: Recent Labs    09/16/19 0430 09/17/19 0345 09/18/19 0145  DDIMER 1.11* 0.51* 0.50  FERRITIN 1,155* 756* 893*  CRP 13.5* 10.7* 6.1*    Lab Results  Component Value Date   SARSCOV2NAA POSITIVE (A) 09/15/2019   SARSCOV2NAA Detected (A) 09/10/2019     COVID-19 Medications: Steroids: 10/7>> Remdesivir: 10/7>> Actemra: Not indicated Convalescent Plasma: 10/8 x 1 Research Studies:N/A  Other medications: Diuretics:Euvolemic-no need for lasix Antibiotics:Not needed as no evidence of bacterial infection  Prone/Incentive Spirometry: encouraged patient to lie prone for 3-4 hours at a time for a total of 16 hours a day, and to encourage incentive spirometry use 3-4/hour  DVT Prophylaxis  : Eliquis   Transaminitis: Secondary to  COVID-19-downtrending-follow  DM-2: CBGs on the higher side-steroid being tapered down-for now continue with Lantus 12 units and SSI.    CBG (last 3)  Recent Labs    09/15/19 2201 09/16/19 0753  GLUCAP 138* 213*    PAF: Sinus rhythm-resume Cardizem and Eliquis  Dyslipidemia: Hold statin due to elevated LFTs  Chronic kidney stage III: Creatinine remains close to usual  baseline-follow periodically.  GERD: Continue PPI  History of prostate cancer-stable for follow-up in the outpatient setting  ABG: No results found for: PHART, PCO2ART, PO2ART, HCO3, TCO2, ACIDBASEDEF, O2SAT  Vent Settings: N/A   Condition -stable  Family Communication  :  Daughter updated over the phone on 10/10  Code Status : DNI (reconfirmed with patient-RN at bedside on 10/8).  Daughter updated on 10/8 as well.  Diet :  Diet Order            Diet heart healthy/carb modified Room service appropriate? Yes; Fluid consistency: Thin  Diet effective now               Disposition Plan  :  Remain hospitalized-Home with home health services on 10/11  Barriers to discharge: Hypoxemia-he needs to complete 5 days of Remdesivir  Consults  :  None  Procedures  :  None  Antibiotics  :    Anti-infectives (From admission, onward)   Start     Dose/Rate Route Frequency Ordered Stop   09/16/19 1600  remdesivir 100 mg in sodium chloride 0.9 % 250 mL IVPB     100 mg 500 mL/hr over 30 Minutes Intravenous Every 24 hours 09/15/19 2129 09/20/19 1559   09/16/19 0000  remdesivir 200 mg in sodium chloride 0.9 % 250 mL IVPB     200 mg 500 mL/hr over 30 Minutes Intravenous Once 09/15/19 2129 09/16/19 0751      Inpatient Medications  Scheduled Meds:  albuterol  2 puff Inhalation Q6H   apixaban  2.5 mg Oral BID   diltiazem  240 mg Oral Daily   feeding supplement (GLUCERNA SHAKE)  237 mL Oral TID BM   insulin aspart  0-15 Units Subcutaneous TID WC   insulin aspart  0-5 Units Subcutaneous QHS   insulin  glargine  12 Units Subcutaneous Daily   [START ON 09/19/2019] levothyroxine  25 mcg Oral Once per day on Sun   methylPREDNISolone (SOLU-MEDROL) injection  60 mg Intravenous BID   pantoprazole  40 mg Oral BID   senna-docusate  1 tablet Oral BID   sodium chloride flush  3 mL Intravenous Q12H   sodium chloride flush  3 mL Intravenous Q12H   vitamin C  500 mg Oral Daily   zinc sulfate  220 mg Oral Daily   Continuous Infusions:  sodium chloride     remdesivir 100 mg in NS 250 mL 100 mg (09/17/19 1545)   PRN Meds:.sodium chloride, acetaminophen, albuterol, chlorpheniramine-HYDROcodone, colchicine, guaiFENesin-dextromethorphan, ondansetron **OR** ondansetron (ZOFRAN) IV, sodium chloride flush   Time Spent in minutes  25   See all Orders from today for further details   Oren Binet M.D on 09/18/2019 at 2:45 PM  To page go to [email or web address] - use universal password  [facility] -  Office  [phone]    Objective:   Vitals:   09/17/19 1721 09/17/19 2047 09/18/19 0350 09/18/19 0819  BP: (!) 123/55 (!) 104/48  116/62  Pulse: 61   62  Resp: 17     Temp: (!) 97.5 F (36.4 C) (!) 97.5 F (36.4 C) 98 F (36.7 C) 97.6 F (36.4 C)  TempSrc: Oral Oral Oral Oral  SpO2: 90%   99%  Weight:      Height:        Wt Readings from Last 3 Encounters:  09/15/19 73.2 kg  04/14/19 80.3 kg  01/26/16 80.3 kg     Intake/Output Summary (Last 24 hours) at 09/18/2019 1445 Last data filed  at 09/18/2019 0350 Gross per 24 hour  Intake --  Output 200 ml  Net -200 ml     Physical Exam Gen Exam:Alert awake-not in any distress HEENT:atraumatic, normocephalic Chest: B/L clear to auscultation anteriorly CVS:S1S2 regular Abdomen:soft non tender, non distended Extremities:no edema Neurology: Non focal Skin: no rash   Data Review:    CBC Recent Labs  Lab 09/15/19 1130 09/16/19 0430 09/17/19 0345 09/18/19 0145  WBC 7.1 7.8 11.1* 18.5*  HGB 15.5 16.6 15.7 14.2   HCT 46.3 48.8 46.9 41.8  PLT 172 156 207 209  MCV 93.5 94.4 94.9 92.1  MCH 31.3 32.1 31.8 31.3  MCHC 33.5 34.0 33.5 34.0  RDW 12.9 12.9 12.9 12.7  LYMPHSABS 0.6* 0.5* 0.6* 0.6*  MONOABS 0.5 0.2 0.3 0.6  EOSABS 0.0 0.0 0.0 0.0  BASOSABS 0.0 0.0 0.0 0.0    Chemistries  Recent Labs  Lab 09/15/19 1130 09/16/19 0430 09/17/19 0345 09/18/19 0145  NA 130* 131* 134* 132*  K 4.1 3.9 3.3* 3.6  CL 97* 94* 93* 97*  CO2 21* 21* 23 20*  GLUCOSE 108* 206* 216* 205*  BUN 31* 30* 46* 53*  CREATININE 1.60* 1.36* 1.51* 1.63*  CALCIUM 8.6* 8.6* 9.2 9.1  MG  --  2.3 2.7* 2.5*  AST 99* 116* 84* 66*  ALT 63* 71* 66* 58*  ALKPHOS 78 93 93 89  BILITOT 0.9 0.8 0.6 0.6   ------------------------------------------------------------------------------------------------------------------ No results for input(s): CHOL, HDL, LDLCALC, TRIG, CHOLHDL, LDLDIRECT in the last 72 hours.  Lab Results  Component Value Date   HGBA1C 6.7 (H) 09/16/2019   ------------------------------------------------------------------------------------------------------------------ No results for input(s): TSH, T4TOTAL, T3FREE, THYROIDAB in the last 72 hours.  Invalid input(s): FREET3 ------------------------------------------------------------------------------------------------------------------ Recent Labs    09/17/19 0345 09/18/19 0145  FERRITIN 756* 893*    Coagulation profile No results for input(s): INR, PROTIME in the last 168 hours.  Recent Labs    09/17/19 0345 09/18/19 0145  DDIMER 0.51* 0.50    Cardiac Enzymes No results for input(s): CKMB, TROPONINI, MYOGLOBIN in the last 168 hours.  Invalid input(s): CK ------------------------------------------------------------------------------------------------------------------    Component Value Date/Time   BNP 215.5 (H) 09/17/2019 0345    Micro Results Recent Results (from the past 240 hour(s))  Novel Coronavirus, NAA ([facility])     Status:  Abnormal   Collection Time: 09/10/19 12:00 AM   Specimen: Nasopharyngeal(NP) swabs in vial transport medium   NASOPHARYNGE  TESTING  Result Value Ref Range Status   SARS-CoV-2, NAA Detected (A) Not Detected Final    Comment: Testing was performed using the cobas(R) SARS-CoV-2 test. This nucleic acid amplification test was developed and its performance characteristics determined by [employer]. Nucleic acid amplification tests include PCR and TMA. This test has not been FDA cleared or approved. This test has been authorized by FDA under an Emergency Use Authorization (EUA). This test is only authorized for the duration of time the declaration that circumstances exist justifying the authorization of the emergency use of in vitro diagnostic tests for detection of SARS-CoV-2 virus and/or diagnosis of COVID-19 infection under section 564(b)(1) of the Act, 21 U.S.C. [id number]) (1), unless the authorization is terminated or revoked sooner. When diagnostic testing is negative, the possibility of a false negative result should be considered in the context of a patient's recent exposures and the presence of clinical signs and symptoms consistent with COVID-19. An individual without symptoms  of COVID-19 and who is not shedding SARS-CoV-2 virus would expect to have a negative (not  detected) result in this assay.   Blood Culture (routine x 2)     Status: None (Preliminary result)   Collection Time: 09/15/19 10:55 AM   Specimen: BLOOD  Result Value Ref Range Status   Specimen Description   Final    BLOOD BLOOD RIGHT FOREARM DISTAL Performed at [facility] [address] [place], [place] [address]    Special Requests   Final    BOTTLES DRAWN AEROBIC AND ANAEROBIC Blood Culture results may not be optimal due to an excessive volume of blood received in culture bottles Performed at [facility] [address] [place], [place] [address]     Culture   Final    NO GROWTH 2 DAYS Performed at [facility] [address], [place], [place] [address]    Report Status PENDING  Incomplete  Blood Culture (routine x 2)     Status: None (Preliminary result)   Collection Time: 09/15/19 11:13 AM   Specimen: BLOOD  Result Value Ref Range Status   Specimen Description   Final    BLOOD BLOOD RIGHT FOREARM Performed at [facility] [address], [place], [place] [address]    Special Requests   Final    BOTTLES DRAWN AEROBIC AND ANAEROBIC Blood Culture results may not be optimal due to an excessive volume of blood received in culture bottles Performed at [facility] [address] [place], [place] [address]    Culture   Final    NO GROWTH 2 DAYS Performed at [facility] [address], [place], [place] [address]    Report Status PENDING  Incomplete  SARS Coronavirus 2 [facility] order, Performed in [facility] hospital lab) Nasopharyngeal Nasopharyngeal Swab     Status: Abnormal   Collection Time: 09/15/19 11:29 AM   Specimen: Nasopharyngeal Swab  Result Value Ref Range Status   SARS Coronavirus 2 POSITIVE (A) NEGATIVE Final    Comment: RESULT CALLED TO, READ BACK BY AND VERIFIED WITH:  Dorothyann Gibbs 09/15/19 JM Performed at [facility], [place] [address], [place], [place] [address]     Radiology Reports Dg Chest Portable 1 View  Result Date: 09/15/2019 CLINICAL DATA:  COVID-19 positive.  Weakness EXAM: PORTABLE CHEST 1 VIEW COMPARISON:  01/24/2016 FINDINGS: The heart size and mediastinal contours are within normal limits. Chronic left basilar scarring, unchanged from prior. No new focal airspace consolidation. No pleural effusion. No pneumothorax. No acute osseous findings. IMPRESSION: Chronic left basilar scarring.  Lungs appear otherwise clear. Electronically Signed   By: Davina Poke M.D.   On: 09/15/2019 11:52

## 2019-09-18 NOTE — Plan of Care (Signed)

## 2019-09-19 LAB — COMPREHENSIVE METABOLIC PANEL
ALT: 57 U/L — ABNORMAL HIGH (ref 0–44)
AST: 51 U/L — ABNORMAL HIGH (ref 15–41)
Albumin: 2.9 g/dL — ABNORMAL LOW (ref 3.5–5.0)
Alkaline Phosphatase: 93 U/L (ref 38–126)
Anion gap: 11 (ref 5–15)
BUN: 50 mg/dL — ABNORMAL HIGH (ref 8–23)
CO2: 23 mmol/L (ref 22–32)
Calcium: 8.9 mg/dL (ref 8.9–10.3)
Chloride: 98 mmol/L (ref 98–111)
Creatinine, Ser: 1.54 mg/dL — ABNORMAL HIGH (ref 0.61–1.24)
GFR calc Af Amer: 47 mL/min — ABNORMAL LOW (ref >60)
GFR calc non Af Amer: 41 mL/min — ABNORMAL LOW (ref >60)
Glucose, Bld: 189 mg/dL — ABNORMAL HIGH (ref 70–99)
Potassium: 4 mmol/L (ref 3.5–5.1)
Sodium: 132 mmol/L — ABNORMAL LOW (ref 135–145)
Total Bilirubin: 1.1 mg/dL (ref 0.3–1.2)
Total Protein: 6.2 g/dL — ABNORMAL LOW (ref 6.5–8.1)

## 2019-09-19 LAB — CBC WITH DIFFERENTIAL/PLATELET
Abs Immature Granulocytes: 0.19 10*3/uL — ABNORMAL HIGH (ref 0.00–0.07)
Basophils Absolute: 0 10*3/uL (ref 0.0–0.1)
Basophils Relative: 0 %
Eosinophils Absolute: 0 10*3/uL (ref 0.0–0.5)
Eosinophils Relative: 0 %
HCT: 41.6 % (ref 39.0–52.0)
Hemoglobin: 14.1 g/dL (ref 13.0–17.0)
Immature Granulocytes: 1 %
Lymphocytes Relative: 4 %
Lymphs Abs: 0.6 10*3/uL — ABNORMAL LOW (ref 0.7–4.0)
MCH: 31.5 pg (ref 26.0–34.0)
MCHC: 33.9 g/dL (ref 30.0–36.0)
MCV: 93.1 fL (ref 80.0–100.0)
Monocytes Absolute: 0.5 10*3/uL (ref 0.1–1.0)
Monocytes Relative: 3 %
Neutro Abs: 13.1 10*3/uL — ABNORMAL HIGH (ref 1.7–7.7)
Neutrophils Relative %: 92 %
Platelets: 212 10*3/uL (ref 150–400)
RBC: 4.47 MIL/uL (ref 4.22–5.81)
RDW: 12.8 % (ref 11.5–15.5)
WBC: 14.4 10*3/uL — ABNORMAL HIGH (ref 4.0–10.5)
nRBC: 0 % (ref 0.0–0.2)

## 2019-09-19 LAB — D-DIMER, QUANTITATIVE: D-Dimer, Quant: 0.52 ug/mL-FEU — ABNORMAL HIGH (ref 0.00–0.50)

## 2019-09-19 LAB — GLUCOSE, CAPILLARY
Glucose-Capillary: 203 mg/dL — ABNORMAL HIGH (ref 70–99)
Glucose-Capillary: 357 mg/dL — ABNORMAL HIGH (ref 70–99)

## 2019-09-19 LAB — MAGNESIUM: Magnesium: 2.5 mg/dL — ABNORMAL HIGH (ref 1.7–2.4)

## 2019-09-19 LAB — FERRITIN: Ferritin: 923 ng/mL — ABNORMAL HIGH (ref 24–336)

## 2019-09-19 LAB — C-REACTIVE PROTEIN: CRP: 2.9 mg/dL — ABNORMAL HIGH (ref <1.0)

## 2019-09-19 MED ORDER — METHYLPREDNISOLONE SODIUM SUCC 40 MG IJ SOLR
40.0000 mg | Freq: Every day | INTRAMUSCULAR | Status: DC
  Administered 2019-09-19: 40 mg via INTRAVENOUS
  Filled 2019-09-19: qty 1

## 2019-09-19 NOTE — TOC Transition Note (Signed)
Transition of Care [facility]) - CM/SW Discharge Note   Patient Details  Name: Charles Boyer MRN: [id number] Date of Birth: [birth year]  [facility] CM/SW Contact:  Ninfa Meeker, RN Phone Number: 09/19/2019, 12:21 PM   Clinical Narrative:   Patient is a24 y.o.malewith PMHx of DM-2, HTN, PAF-on Eliquis-COVID positive on 10/2-presented with shortness of breath and cough. Admitted with acute hypoxemic respiratory failure in the setting of COVID-19 pneumonia. Patient is from home with wife. Case manager spoke with patient via telephone to discuss need for Home Health at discharge. Choice was offered, referral was called to [facility]. Case manager contacted Brianne, [facility] and requested a RW be taken to patient's room. Patient will discharge home with family.   Final next level of care: [place] Barriers to Discharge: No Barriers Identified   Patient Goals and CMS Choice Patient states their goals for this hospitalization and ongoing recovery are:: continue to get better CMS Medicare.gov Compare Post Acute Care list provided to:: Patient(verbally) Choice offered to / list presented to : Patient  Discharge Placement                       Discharge Plan and Services     Post Acute Care Choice: Home Health, Durable Medical Equipment          DME Arranged: [place] rolling DME Agency: [place] Date DME Agency Contacted: 09/19/19 Time DME Agency Contacted: 1215 Representative spoke with at DME Agency: Learta Codding HH Arranged: PT, OT [facility] Agency: [place] Date [place]: 09/19/19 Time [place]: 1215 Representative spoke with at [place]: [place] ([place]) Interventions     Readmission Risk Interventions No flowsheet data found.

## 2019-09-19 NOTE — Discharge Summary (Signed)
Charles Boyer WUJ:[id number] DOB: 02-10-1932 DOA: 09/15/2019  PCP: Jani Gravel, MD  Admit date: 09/15/2019  Discharge date: 09/19/2019  Admitted From: Home   Disposition:  Home   Recommendations for Outpatient Follow-up:   Follow up with PCP in 1-2 weeks  PCP Please obtain BMP/CBC, 2 view CXR in 1week,  (see Discharge instructions)   PCP Please follow up on the following pending results:    Home Health: None   Equipment/Devices: None  Consultations: None  Discharge Condition: Stable    CODE STATUS: Full    Diet Recommendation: Heart Healthy Low Carb    Chief Complaint  Patient presents with  . COVID  . Fatigue     Brief history of present illness from the day of admission and additional interim summary    Patient is a 85 y.o. male with PMHx of DM-2, HTN, PAF-on Eliquis-COVID positive on 10/2-presented with shortness of breath and cough.  Found to have acute hypoxemic respiratory failure in the setting of COVID-19 pneumonia.                                                                 Hospital Course   Acute Hypoxic Resp Failure due to Covid 19 Viral pneumonia: Much improved-has been on room air for the past 3 days.    He was treated with IV steroids, Remdisvir and convalescent plasma, finishes his Remdisvir course today.  Symptom-free, he is adamant to go home today and refuses to stay for even 1 more day.  Will be discharged home on home medications with PCP follow-up in a week.  COVID-19 Labs  Recent Labs    09/17/19 0345 09/18/19 0145 09/19/19 0350  DDIMER 0.51* 0.50 0.52*  FERRITIN 756* 893*  --   CRP 10.7* 6.1*  --     Lab Results  Component Value Date   SARSCOV2NAA POSITIVE (A) 09/15/2019   SARSCOV2NAA Detected (A) 09/10/2019    Hepatic Function Latest Ref Rng & Units 09/18/2019  09/17/2019 09/16/2019  Total Protein 6.5 - 8.1 g/dL 6.4(L) 7.3 7.2  Albumin 3.5 - 5.0 g/dL 2.9(L) 3.3(L) 3.2(L)  AST 15 - 41 U/L 66(H) 84(H) 116(H)  ALT 0 - 44 U/L 58(H) 66(H) 71(H)  Alk Phosphatase 38 - 126 U/L 89 93 93  Total Bilirubin 0.3 - 1.2 mg/dL 0.6 0.6 0.8  Bilirubin, Direct 0.0 - 0.3 mg/dL - - -     PAF: Sinus rhythm-resume Cardizem and Eliquis  Dyslipidemia: Hold statin due to elevated LFTs  Chronic kidney stage III: Creatinine remains close to usual baseline-follow periodically.  GERD: Continue PPI  History of prostate cancer-stable for follow-up in the outpatient setting  Mild COVID-19 related transaminitis.  Stable.  Repeat CMP in 7 to 10 days by PCP.  Symptom-free.   Discharge diagnosis  Active Problems:   Acute hypoxemic respiratory failure due to COVID-19 [facility])    Discharge instructions    Discharge Instructions    Discharge instructions   Complete by: As directed    Follow with Primary MD Jani Gravel, MD in 7 days   Get CBC, CMP, 2 view Chest X ray -  checked next visit within 1 week by Primary MD   Activity: As tolerated with Full fall precautions use walker/cane & assistance as needed  Disposition Home   Diet: Heart Healthy - Low Carb.   Special Instructions: If you have smoked or chewed Tobacco  in the last 2 yrs please stop smoking, stop any regular Alcohol  and or any Recreational drug use.  On your next visit with your primary care physician please Get Medicines reviewed and adjusted.  Please request your Prim.MD to go over all Hospital Tests and Procedure/Radiological results at the follow up, please get all Hospital records sent to your Prim MD by signing hospital release before you go home.  If you experience worsening of your admission symptoms, develop shortness of breath, life threatening emergency, suicidal or homicidal thoughts you must seek medical attention immediately by calling 911 or calling your MD immediately  if  symptoms less severe.  You Must read complete instructions/literature along with all the possible adverse reactions/side effects for all the Medicines you take and that have been prescribed to you. Take any new Medicines after you have completely understood and accpet all the possible adverse reactions/side effects.   Increase activity slowly   Complete by: As directed       Discharge Medications   Allergies as of 09/19/2019      Reactions   Byetta 10 Mcg Pen [exenatide]    Prednisone Other (See Comments)   MEMORY ISSUES. HALLUCINATIONS.   Corticosteroids Other (See Comments)   HALLUCINATIONS, MEMORY ISSUES   Nitroglycerin    Hypotension, sinus bradycardia and syncope after sublingual Notroglycerin      Medication List    TAKE these medications   colchicine 0.6 MG tablet Take 0.6 mg by mouth as needed (gout).   Eliquis 2.5 MG Tabs tablet Generic drug: apixaban TAKE 1 TABLET (2.5 MG TOTAL) BY MOUTH 2 (TWO) TIMES DAILY. What changed: See the new instructions.   l-methylfolate-B6-B12 3-35-2 MG Tabs tablet Commonly known as: METANX Take 1 tablet by mouth 2 (two) times daily.   levothyroxine 25 MCG tablet Commonly known as: SYNTHROID Take 25 mcg by mouth See admin instructions. Pharmacy states they have not filled medication since 2014 however patient states he is taking this on sundays only per patient   Matzim LA 240 MG 24 hr tablet Generic drug: diltiazem TAKE 1 TABLET (240 MG TOTAL) BY MOUTH DAILY. What changed: See the new instructions.   metFORMIN 500 MG tablet Commonly known as: GLUCOPHAGE Take 500 mg by mouth 2 (two) times daily with a meal.   pantoprazole 40 MG tablet Commonly known as: PROTONIX TAKE 1 TABLET BY MOUTH TWICE A DAY   pioglitazone 30 MG tablet Commonly known as: ACTOS Take 30 mg by mouth daily.   pravastatin 20 MG tablet Commonly known as: PRAVACHOL TAKE 1 TABLET (20 MG TOTAL) BY MOUTH DAILY. What changed: See the new instructions.        Follow-up Information    Jani Gravel, MD. Schedule an appointment as soon as possible for a visit in 1 week(s).   Specialty: Internal Medicine Contact information: [address] [place] [address]  [phone]           Major procedures and Radiology Reports - PLEASE review detailed and final reports thoroughly  -        Dg Chest Portable 1 View  Result Date: 09/15/2019 CLINICAL DATA:  COVID-19 positive.  Weakness EXAM: PORTABLE CHEST 1 VIEW COMPARISON:  01/24/2016 FINDINGS: The heart size and mediastinal contours are within normal limits. Chronic left basilar scarring, unchanged from prior. No new focal airspace consolidation. No pleural effusion. No pneumothorax. No acute osseous findings. IMPRESSION: Chronic left basilar scarring.  Lungs appear otherwise clear. Electronically Signed   By: Davina Poke M.D.   On: 09/15/2019 11:52    Micro Results     Recent Results (from the past 240 hour(s))  Novel Coronavirus, NAA ([facility])     Status: Abnormal   Collection Time: 09/10/19 12:00 AM   Specimen: Nasopharyngeal(NP) swabs in vial transport medium   NASOPHARYNGE  TESTING  Result Value Ref Range Status   SARS-CoV-2, NAA Detected (A) Not Detected Final    Comment: Testing was performed using the cobas(R) SARS-CoV-2 test. This nucleic acid amplification test was developed and its performance characteristics determined by [employer]. Nucleic acid amplification tests include PCR and TMA. This test has not been FDA cleared or approved. This test has been authorized by FDA under an Emergency Use Authorization (EUA). This test is only authorized for the duration of time the declaration that circumstances exist justifying the authorization of the emergency use of in vitro diagnostic tests for detection of SARS-CoV-2 virus and/or diagnosis of COVID-19 infection under section 564(b)(1) of the Act, 21 U.S.C. 983JAS-5(K) (1), unless the authorization is  terminated or revoked sooner. When diagnostic testing is negative, the possibility of a false negative result should be considered in the context of a patient's recent exposures and the presence of clinical signs and symptoms consistent with COVID-19. An individual without symptoms  of COVID-19 and who is not shedding SARS-CoV-2 virus would expect to have a negative (not detected) result in this assay.   Blood Culture (routine x 2)     Status: None (Preliminary result)   Collection Time: 09/15/19 10:55 AM   Specimen: BLOOD  Result Value Ref Range Status   Specimen Description   Final    BLOOD BLOOD RIGHT FOREARM DISTAL Performed at [facility] [address] [place], [place] [address]    Special Requests   Final    BOTTLES DRAWN AEROBIC AND ANAEROBIC Blood Culture results may not be optimal due to an excessive volume of blood received in culture bottles Performed at [facility] [address] [place], [place] [address]    Culture   Final    NO GROWTH 3 DAYS Performed at [facility], [place] [address] [place], [place] [address]    Report Status PENDING  Incomplete  Blood Culture (routine x 2)     Status: None (Preliminary result)   Collection Time: 09/15/19 11:13 AM   Specimen: BLOOD  Result Value Ref Range Status   Specimen Description   Final    BLOOD BLOOD RIGHT FOREARM Performed at [place] [address], [place], [place] [address]    Special Requests   Final    BOTTLES DRAWN AEROBIC AND ANAEROBIC Blood Culture results may not be optimal due to an excessive volume of blood received in culture bottles Performed at [facility] [address] [place], [place] [address]    Culture  Final    NO GROWTH 3 DAYS Performed at [facility], [place] [address], [place], [place] [address]    Report Status PENDING  Incomplete  SARS Coronavirus 2 [facility] order, Performed in [facility]  hospital lab) Nasopharyngeal Nasopharyngeal Swab     Status: Abnormal   Collection Time: 09/15/19 11:29 AM   Specimen: Nasopharyngeal Swab  Result Value Ref Range Status   SARS Coronavirus 2 POSITIVE (A) NEGATIVE Final    Comment: RESULT CALLED TO, READ BACK BY AND VERIFIED WITH:  Dorothyann Gibbs 09/15/19 JM Performed at [facility], [place] [address], [place], [place] [address]     Today   Subjective    Charles Boyer today has no headache,no chest abdominal pain,no new weakness tingling or numbness, feels much better wants to go home today.     Objective   Blood pressure 105/63, pulse (!) 52, temperature 97.8 F (36.6 C), temperature source Oral, resp. rate 18, height '5\' 9"'  (1.753 m), weight 73.2 kg, SpO2 90 %.   Intake/Output Summary (Last 24 hours) at 09/19/2019 0924 Last data filed at 09/19/2019 0616 Gross per 24 hour  Intake -  Output 650 ml  Net -650 ml    Exam Awake Alert, Oriented x 3, No new F.N deficits, Normal affect .AT,PERRAL Supple Neck,No JVD, No cervical lymphadenopathy appriciated.  Symmetrical Chest wall movement, Good air movement bilaterally, CTAB RRR,No Gallops,Rubs or new Murmurs, No Parasternal Heave +ve B.Sounds, Abd Soft, Non tender, No organomegaly appriciated, No rebound -guarding or rigidity. No Cyanosis, Clubbing or edema, No new Rash or bruise   Data Review   CBC w Diff:  Lab Results  Component Value Date   WBC 14.4 (H) 09/19/2019   HGB 14.1 09/19/2019   HCT 41.6 09/19/2019   PLT 212 09/19/2019   LYMPHOPCT 4 09/19/2019   MONOPCT 3 09/19/2019   EOSPCT 0 09/19/2019   BASOPCT 0 09/19/2019    CMP:  Lab Results  Component Value Date   NA 132 (L) 09/18/2019   K 3.6 09/18/2019   CL 97 (L) 09/18/2019   CO2 20 (L) 09/18/2019   BUN 53 (H) 09/18/2019   CREATININE 1.63 (H) 09/18/2019   PROT 6.4 (L) 09/18/2019   ALBUMIN 2.9 (L) 09/18/2019   BILITOT 0.6 09/18/2019   ALKPHOS 89 09/18/2019   AST 66 (H) 09/18/2019   ALT  58 (H) 09/18/2019  .   Total Time in preparing paper work, data evaluation and todays exam - 60 minutes  Lala Lund M.D on 09/19/2019 at 9:24 AM  [facility]   Office  [phone]

## 2019-09-19 NOTE — Discharge Instructions (Signed)
COVID-19: How to Protect Yourself and Others Know how it spreads  There is currently no vaccine to prevent coronavirus disease 2019 (COVID-19).  The best way to prevent illness is to avoid being exposed to this virus.  The virus is thought to spread mainly from person-to-person. ? Between people who are in close contact with one another (within about 6 feet). ? Through respiratory droplets produced when an infected person coughs, sneezes or talks. ? These droplets can land in the mouths or noses of people who are nearby or possibly be inhaled into the lungs. ? Some recent studies have suggested that COVID-19 may be spread by people who are not showing symptoms. Everyone should Clean your hands often  Wash your hands often with soap and water for at least 20 seconds especially after you have been in a public place, or after blowing your nose, coughing, or sneezing.  If soap and water are not readily available, use a hand sanitizer that contains at least 60% alcohol. Cover all surfaces of your hands and rub them together until they feel dry.  Avoid touching your eyes, nose, and mouth with unwashed hands. Avoid close contact  Stay home if you are sick.  Avoid close contact with people who are sick.  Put distance between yourself and other people. ? Remember that some people without symptoms may be able to spread virus. ? This is especially important for people who are at higher risk of getting very GainPain.com.cy Cover your mouth and nose with a cloth face cover when around others  You could spread COVID-19 to others even if you do not feel sick.  Everyone should wear a cloth face cover when they have to go out in public, for example to the grocery store or to pick up other necessities. ? Cloth face coverings should not be placed on young children under age 62, anyone who has trouble breathing, or is  unconscious, incapacitated or otherwise unable to remove the mask without assistance.  The cloth face cover is meant to protect other people in case you are infected.  Do NOT use a facemask meant for a Dietitian.  Continue to keep about 6 feet between yourself and others. The cloth face cover is not a substitute for social distancing. Cover coughs and sneezes  If you are in a private setting and do not have on your cloth face covering, remember to always cover your mouth and nose with a tissue when you cough or sneeze or use the inside of your elbow.  Throw used tissues in the trash.  Immediately wash your hands with soap and water for at least 20 seconds. If soap and water are not readily available, clean your hands with a hand sanitizer that contains at least 60% alcohol. Clean and disinfect  Clean AND disinfect frequently touched surfaces daily. This includes tables, doorknobs, light switches, countertops, handles, desks, phones, keyboards, toilets, faucets, and sinks. RackRewards.fr  If surfaces are dirty, clean them: Use detergent or soap and water prior to disinfection.  Then, use a household disinfectant. You can see a list of EPA-registered household disinfectants here. michellinders.com 04/13/2019 This information is not intended to replace advice given to you by your health care provider. Make sure you discuss any questions you have with your health care provider. Document Released: 03/23/2019 Document Revised: 04/21/2019 Document Reviewed: 03/23/2019 Elsevier Patient Education  2020 [employer].   COVID-19 Frequently Asked Questions COVID-19 (coronavirus disease) is an infection that is caused by a large  family of viruses. Some viruses cause illness in people and others cause illness in animals like camels, cats, and bats. In some cases, the viruses that cause illness in animals can spread to  humans. Where did the coronavirus come from? In December 2019, [place] told the [facility]) of several cases of lung disease (human respiratory illness). These cases were linked to an open seafood and livestock market in the city of [place]. The link to the seafood and livestock market suggests that the virus may have spread from animals to humans. However, since that first outbreak in December, the virus has also been shown to spread from person to person. What is the name of the disease and the virus? Disease name Early on, this disease was called novel coronavirus. This is because scientists determined that the disease was caused by a new (novel) respiratory virus. The World Health Organization [facility]) has now named the disease COVID-19, or coronavirus disease. Virus name The virus that causes the disease is called severe acute respiratory syndrome coronavirus 2 (SARS-CoV-2). More information on disease and virus naming World Health Organization [facility]): www.who.int/emergencies/diseases/novel-coronavirus-2019/technical-guidance/naming-the-coronavirus-disease-(covid-2019)-and-the-virus-that-causes-it Who is at risk for complications from coronavirus disease? Some people may be at higher risk for complications from coronavirus disease. This includes older adults and people who have chronic diseases, such as heart disease, diabetes, and lung disease. If you are at higher risk for complications, take these extra precautions:  Avoid close contact with people who are sick or have a fever or cough. Stay at least 3-6 ft (1-2 m) away from them, if possible.  Wash your hands often with soap and water for at least 20 seconds.  Avoid touching your face, mouth, nose, or eyes.  Keep supplies on hand at home, such as food, medicine, and cleaning supplies.  Stay home as much as possible.  Avoid social gatherings and travel. How does coronavirus disease spread? The virus that causes  coronavirus disease spreads easily from person to person (is contagious). There are also cases of community-spread disease. This means the disease has spread to:  People who have no known contact with other infected people.  People who have not traveled to areas where there are known cases. It appears to spread from one person to another through droplets from coughing or sneezing. Can I get the virus from touching surfaces or objects? There is still a lot that we do not know about the virus that causes coronavirus disease. Scientists are basing a lot of information on what they know about similar viruses, such as:  Viruses cannot generally survive on surfaces for long. They need a human body (host) to survive.  It is more likely that the virus is spread by close contact with people who are sick (direct contact), such as through: ? Shaking hands or hugging. ? Breathing in respiratory droplets that travel through the air. This can happen when an infected person coughs or sneezes on or near other people.  It is less likely that the virus is spread when a person touches a surface or object that has the virus on it (indirect contact). The virus may be able to enter the body if the person touches a surface or object and then touches his or her face, eyes, nose, or mouth. Can a person spread the virus without having symptoms of the disease? It may be possible for the virus to spread before a person has symptoms of the disease, but this is most likely not the main way the  virus is spreading. It is more likely for the virus to spread by being in close contact with people who are sick and breathing in the respiratory droplets of a sick person's cough or sneeze. What are the symptoms of coronavirus disease? Symptoms vary from person to person and can range from mild to severe. Symptoms may include:  Fever.  Cough.  Tiredness, weakness, or fatigue.  Fast breathing or feeling short of breath. These  symptoms can appear anywhere from 2 to 14 days after you have been exposed to the virus. If you develop symptoms, call your health care provider. People with severe symptoms may need hospital care. If I am exposed to the virus, how long does it take before symptoms start? Symptoms of coronavirus disease may appear anywhere from 2 to 14 days after a person has been exposed to the virus. If you develop symptoms, call your health care provider. Should I be tested for this virus? Your health care provider will decide whether to test you based on your symptoms, history of exposure, and your risk factors. How does a health care provider test for this virus? Health care providers will collect samples to send for testing. Samples may include:  Taking a swab of fluid from the nose.  Taking fluid from the lungs by having you cough up mucus (sputum) into a sterile cup.  Taking a blood sample.  Taking a stool or urine sample. Is there a treatment or vaccine for this virus? Currently, there is no vaccine to prevent coronavirus disease. Also, there are no medicines like antibiotics or antivirals to treat the virus. A person who becomes sick is given supportive care, which means rest and fluids. A person may also relieve his or her symptoms by using over-the-counter medicines that treat sneezing, coughing, and runny nose. These are the same medicines that a person takes for the common cold. If you develop symptoms, call your health care provider. People with severe symptoms may need hospital care. What can I do to protect myself and my family from this virus?     You can protect yourself and your family by taking the same actions that you would take to prevent the spread of other viruses. Take the following actions:  Wash your hands often with soap and water for at least 20 seconds. If soap and water are not available, use alcohol-based hand sanitizer.  Avoid touching your face, mouth, nose, or  eyes.  Cough or sneeze into a tissue, sleeve, or elbow. Do not cough or sneeze into your hand or the air. ? If you cough or sneeze into a tissue, throw it away immediately and wash your hands.  Disinfect objects and surfaces that you frequently touch every day.  Avoid close contact with people who are sick or have a fever or cough. Stay at least 3-6 ft (1-2 m) away from them, if possible.  Stay home if you are sick, except to get medical care. Call your health care provider before you get medical care.  Make sure your vaccines are up to date. Ask your health care provider what vaccines you need. What should I do if I need to travel? Follow travel recommendations from your local health authority, the CDC, and WHO. Travel information and advice  Centers for Disease Control and Prevention (CDC): BodyEditor.hu  World Health Organization [facility]): ThirdIncome.ca Know the risks and take action to protect your health  You are at higher risk of getting coronavirus disease if you are traveling to  areas with an outbreak or if you are exposed to travelers from areas with an outbreak.  Wash your hands often and practice good hygiene to lower the risk of catching or spreading the virus. What should I do if I am sick? General instructions to stop the spread of infection  Wash your hands often with soap and water for at least 20 seconds. If soap and water are not available, use alcohol-based hand sanitizer.  Cough or sneeze into a tissue, sleeve, or elbow. Do not cough or sneeze into your hand or the air.  If you cough or sneeze into a tissue, throw it away immediately and wash your hands.  Stay home unless you must get medical care. Call your health care provider or local health authority before you get medical care.  Avoid public areas. Do not take public transportation, if possible.  If you can, wear  a mask if you must go out of the house or if you are in close contact with someone who is not sick. Keep your home clean  Disinfect objects and surfaces that are frequently touched every day. This may include: ? Counters and tables. ? Doorknobs and light switches. ? Sinks and faucets. ? Electronics such as phones, remote controls, keyboards, computers, and tablets.  Wash dishes in hot, soapy water or use a dishwasher. Air-dry your dishes.  Wash laundry in hot water. Prevent infecting other household members  Let healthy household members care for children and pets, if possible. If you have to care for children or pets, wash your hands often and wear a mask.  Sleep in a different bedroom or bed, if possible.  Do not share personal items, such as razors, toothbrushes, deodorant, combs, brushes, towels, and washcloths. Where to find more information Centers for Disease Control and Prevention (CDC)  Information and news updates: https://www.butler-gonzalez.com/ World Health Organization [facility]  Information and news updates: MissExecutive.com.ee  Coronavirus health topic: https://www.castaneda.info/  Questions and answers on COVID-19: OpportunityDebt.at  Global tracker: who.sprinklr.com American Academy of Pediatrics (AAP)  Information for families: www.healthychildren.org/English/health-issues/conditions/chest-lungs/Pages/2019-Novel-Coronavirus.aspx The coronavirus situation is changing rapidly. Check your local health authority website or the CDC and [facility] websites for updates and news. When should I contact a health care provider?  Contact your health care provider if you have symptoms of an infection, such as fever or cough, and you: ? Have been near anyone who is known to have coronavirus disease. ? Have come into contact with a person who is suspected to have coronavirus disease. ? Have traveled  outside of the country. When should I get emergency medical care?  Get help right away by calling your local emergency services (911 in the [place] if you have: ? Trouble breathing. ? Pain or pressure in your chest. ? Confusion. ? Blue-tinged lips and fingernails. ? Difficulty waking from sleep. ? Symptoms that get worse. Let the emergency medical personnel know if you think you have coronavirus disease. Summary  A new respiratory virus is spreading from person to person and causing COVID-19 (coronavirus disease).  The virus that causes COVID-19 appears to spread easily. It spreads from one person to another through droplets from coughing or sneezing.  Older adults and those with chronic diseases are at higher risk of disease. If you are at higher risk for complications, take extra precautions.  There is currently no vaccine to prevent coronavirus disease. There are no medicines, such as antibiotics or antivirals, to treat the virus.  You can protect yourself and your family by  washing your hands often, avoiding touching your face, and covering your coughs and sneezes. This information is not intended to replace advice given to you by your health care provider. Make sure you discuss any questions you have with your health care provider. Document Released: 03/23/2019 Document Revised: 03/23/2019 Document Reviewed: 03/23/2019 Elsevier Patient Education  2020 [place]. Follow with Primary MD Jani Gravel, MD in 7 days   Get CBC, CMP, 2 view Chest X ray -  checked next visit within 1 week by Primary MD   Activity: As tolerated with Full fall precautions use walker/cane & assistance as needed  Disposition Home   Diet: Heart Healthy - Low Carb.   Special Instructions: If you have smoked or chewed Tobacco  in the last 2 yrs please stop smoking, stop any regular Alcohol  and or any Recreational drug use.  On your next visit with your primary care physician please Get Medicines reviewed  and adjusted.  Please request your Prim.MD to go over all Hospital Tests and Procedure/Radiological results at the follow up, please get all Hospital records sent to your Prim MD by signing hospital release before you go home.  If you experience worsening of your admission symptoms, develop shortness of breath, life threatening emergency, suicidal or homicidal thoughts you must seek medical attention immediately by calling 911 or calling your MD immediately  if symptoms less severe.  You Must read complete instructions/literature along with all the possible adverse reactions/side effects for all the Medicines you take and that have been prescribed to you. Take any new Medicines after you have completely understood and accpet all the possible adverse reactions/side effects.        Person Under Monitoring Name: Charles Boyer  Location: [address] [place] [address]   Infection Prevention Recommendations for Individuals Confirmed to have, or Being Evaluated for, 2019 Novel Coronavirus (COVID-19) Infection Who Receive Care at Home  Individuals who are confirmed to have, or are being evaluated for, COVID-19 should follow the prevention steps below until a healthcare provider or local or state health department says they can return to normal activities.  Stay home except to get medical care You should restrict activities outside your home, except for getting medical care. Do not go to work, school, or public areas, and do not use public transportation or taxis.  Call ahead before visiting your doctor Before your medical appointment, call the healthcare provider and tell them that you have, or are being evaluated for, COVID-19 infection. This will help the healthcare providers office take steps to keep other people from getting infected. Ask your healthcare provider to call the local or state health department.  Monitor your symptoms Seek prompt medical attention if your illness is  worsening (e.g., difficulty breathing). Before going to your medical appointment, call the healthcare provider and tell them that you have, or are being evaluated for, COVID-19 infection. Ask your healthcare provider to call the local or state health department.  Wear a facemask You should wear a facemask that covers your nose and mouth when you are in the same room with other people and when you visit a healthcare provider. People who live with or visit you should also wear a facemask while they are in the same room with you.  Separate yourself from other people in your home As much as possible, you should stay in a different room from other people in your home. Also, you should use a separate bathroom, if available.  Avoid  sharing household items You should not share dishes, drinking glasses, cups, eating utensils, towels, bedding, or other items with other people in your home. After using these items, you should wash them thoroughly with soap and water.  Cover your coughs and sneezes Cover your mouth and nose with a tissue when you cough or sneeze, or you can cough or sneeze into your sleeve. Throw used tissues in a lined trash can, and immediately wash your hands with soap and water for at least 20 seconds or use an alcohol-based hand rub.  Wash your [facility] your hands often and thoroughly with soap and water for at least 20 seconds. You can use an alcohol-based hand sanitizer if soap and water are not available and if your hands are not visibly dirty. Avoid touching your eyes, nose, and mouth with unwashed hands.   Prevention Steps for Caregivers and Household Members of Individuals Confirmed to have, or Being Evaluated for, COVID-19 Infection Being Cared for in the Home  If you live with, or provide care at home for, a person confirmed to have, or being evaluated for, COVID-19 infection please follow these guidelines to prevent infection:  Follow healthcare providers  instructions Make sure that you understand and can help the patient follow any healthcare provider instructions for all care.  Provide for the patients basic needs You should help the patient with basic needs in the home and provide support for getting groceries, prescriptions, and other personal needs.  Monitor the patients symptoms If they are getting sicker, call his or her medical provider and tell them that the patient has, or is being evaluated for, COVID-19 infection. This will help the healthcare providers office take steps to keep other people from getting infected. Ask the healthcare provider to call the local or state health department.  Limit the number of people who have contact with the patient  If possible, have only one caregiver for the patient.  Other household members should stay in another home or place of residence. If this is not possible, they should stay  in another room, or be separated from the patient as much as possible. Use a separate bathroom, if available.  Restrict visitors who do not have an essential need to be in the home.  Keep older adults, very young children, and other sick people away from the patient Keep older adults, very young children, and those who have compromised immune systems or chronic health conditions away from the patient. This includes people with chronic heart, lung, or kidney conditions, diabetes, and cancer.  Ensure good ventilation Make sure that shared spaces in the home have good air flow, such as from an air conditioner or an opened window, weather permitting.  Wash your hands often  Wash your hands often and thoroughly with soap and water for at least 20 seconds. You can use an alcohol based hand sanitizer if soap and water are not available and if your hands are not visibly dirty.  Avoid touching your eyes, nose, and mouth with unwashed hands.  Use disposable paper towels to dry your hands. If not available, use  dedicated cloth towels and replace them when they become wet.  Wear a facemask and gloves  Wear a disposable facemask at all times in the room and gloves when you touch or have contact with the patients blood, body fluids, and/or secretions or excretions, such as sweat, saliva, sputum, nasal mucus, vomit, urine, or feces.  Ensure the mask fits over your nose and mouth  tightly, and do not touch it during use.  Throw out disposable facemasks and gloves after using them. Do not reuse.  Wash your hands immediately after removing your facemask and gloves.  If your personal clothing becomes contaminated, carefully remove clothing and launder. Wash your hands after handling contaminated clothing.  Place all used disposable facemasks, gloves, and other waste in a lined container before disposing them with other household waste.  Remove gloves and wash your hands immediately after handling these items.  Do not share dishes, glasses, or other household items with the patient  Avoid sharing household items. You should not share dishes, drinking glasses, cups, eating utensils, towels, bedding, or other items with a patient who is confirmed to have, or being evaluated for, COVID-19 infection.  After the person uses these items, you should wash them thoroughly with soap and water.  Wash laundry thoroughly  Immediately remove and wash clothes or bedding that have blood, body fluids, and/or secretions or excretions, such as sweat, saliva, sputum, nasal mucus, vomit, urine, or feces, on them.  Wear gloves when handling laundry from the patient.  Read and follow directions on labels of laundry or clothing items and detergent. In general, wash and dry with the warmest temperatures recommended on the label.  Clean all areas the individual has used often  Clean all touchable surfaces, such as counters, tabletops, doorknobs, bathroom fixtures, toilets, phones, keyboards, tablets, and bedside tables, every  day. Also, clean any surfaces that may have blood, body fluids, and/or secretions or excretions on them.  Wear gloves when cleaning surfaces the patient has come in contact with.  Use a diluted bleach solution (e.g., dilute bleach with 1 part bleach and 10 parts water) or a household disinfectant with a label that says EPA-registered for coronaviruses. To make a bleach solution at home, add 1 tablespoon of bleach to 1 quart (4 cups) of water. For a larger supply, add  cup of bleach to 1 gallon (16 cups) of water.  Read labels of cleaning products and follow recommendations provided on product labels. Labels contain instructions for safe and effective use of the cleaning product including precautions you should take when applying the product, such as wearing gloves or eye protection and making sure you have good ventilation during use of the product.  Remove gloves and wash hands immediately after cleaning.  Monitor yourself for signs and symptoms of illness Caregivers and household members are considered close contacts, should monitor their health, and will be asked to limit movement outside of the home to the extent possible. Follow the monitoring steps for close contacts listed on the symptom monitoring form.   ? If you have additional questions, contact your local health department or call the epidemiologist on call at [phone] (available 24/7). ? This guidance is subject to change. For the most up-to-date guidance from [facility], please refer to their website: [email or web address]

## 2019-09-19 NOTE — Plan of Care (Signed)
  Problem: Education: Goal: Knowledge of General Education information will improve Description: Including pain rating scale, medication(s)/side effects and non-pharmacologic comfort measures Outcome: Adequate for Discharge   Problem: Health Behavior/Discharge Planning: Goal: Ability to manage health-related needs will improve Outcome: Adequate for Discharge   Problem: Clinical Measurements: Goal: Ability to maintain clinical measurements within normal limits will improve Outcome: Adequate for Discharge Goal: Will remain free from infection Outcome: Adequate for Discharge Goal: Diagnostic test results will improve Outcome: Adequate for Discharge Goal: Respiratory complications will improve Outcome: Adequate for Discharge Goal: Cardiovascular complication will be avoided Outcome: Adequate for Discharge   Problem: Activity: Goal: Risk for activity intolerance will decrease Outcome: Adequate for Discharge   Problem: Nutrition: Goal: Adequate nutrition will be maintained Outcome: Adequate for Discharge   Problem: Coping: Goal: Level of anxiety will decrease Outcome: Adequate for Discharge   Problem: Elimination: Goal: Will not experience complications related to bowel motility Outcome: Adequate for Discharge Goal: Will not experience complications related to urinary retention Outcome: Adequate for Discharge   Problem: Pain Managment: Goal: General experience of comfort will improve Outcome: Adequate for Discharge   Problem: Safety: Goal: Ability to remain free from injury will improve Outcome: Adequate for Discharge   Problem: Skin Integrity: Goal: Risk for impaired skin integrity will decrease Outcome: Adequate for Discharge   Problem: Education: Goal: Knowledge of risk factors and measures for prevention of condition will improve Outcome: Adequate for Discharge   Problem: Coping: Goal: Psychosocial and spiritual needs will be supported Outcome: Adequate for  Discharge   Problem: Respiratory: Goal: Will maintain a patent airway Outcome: Adequate for Discharge Goal: Complications related to the disease process, condition or treatment will be avoided or minimized Outcome: Adequate for Discharge   Problem: Acute Rehab PT Goals(only PT should resolve) Goal: Pt Will Go Supine/Side To Sit Outcome: Adequate for Discharge Goal: Pt Will Go Sit To Supine/Side Outcome: Adequate for Discharge Goal: Patient Will Transfer Sit To/From Stand Outcome: Adequate for Discharge Goal: Pt Will Ambulate Outcome: Adequate for Discharge Goal: Pt/caregiver will Perform Home Exercise Program Outcome: Adequate for Discharge   Problem: Acute Rehab OT Goals (only OT should resolve) Goal: Pt. Will Perform Lower Body Bathing Outcome: Adequate for Discharge Goal: Pt. Will Perform Lower Body Dressing Outcome: Adequate for Discharge Goal: Pt. Will Transfer To Toilet Outcome: Adequate for Discharge Goal: Pt/Caregiver Will Perform Home Exercise Program Outcome: Adequate for Discharge Goal: OT Additional ADL Goal #1 Outcome: Adequate for Discharge

## 2019-09-19 NOTE — Progress Notes (Signed)
PIV removed, Remdesivir given, RW given to pt and taken to car, D/c via wheelchair. No signs of respiratory distress. VSS. AVS explained and pt has no further questions about d/c instructions. Daughter picked up pt.

## 2019-09-20 DIAGNOSIS — U071 COVID-19: Secondary | ICD-10-CM | POA: Diagnosis not present

## 2019-09-20 LAB — CULTURE, BLOOD (ROUTINE X 2)
Culture: NO GROWTH
Culture: NO GROWTH

## 2019-09-20 LAB — GLUCOSE, CAPILLARY: Glucose-Capillary: 257 mg/dL — ABNORMAL HIGH (ref 70–99)

## 2019-09-22 DIAGNOSIS — E1122 Type 2 diabetes mellitus with diabetic chronic kidney disease: Secondary | ICD-10-CM | POA: Diagnosis not present

## 2019-09-22 DIAGNOSIS — I129 Hypertensive chronic kidney disease with stage 1 through stage 4 chronic kidney disease, or unspecified chronic kidney disease: Secondary | ICD-10-CM | POA: Diagnosis not present

## 2019-09-22 DIAGNOSIS — J80 Acute respiratory distress syndrome: Secondary | ICD-10-CM | POA: Diagnosis not present

## 2019-09-22 DIAGNOSIS — J1289 Other viral pneumonia: Secondary | ICD-10-CM | POA: Diagnosis not present

## 2019-09-22 DIAGNOSIS — U071 COVID-19: Secondary | ICD-10-CM | POA: Diagnosis not present

## 2019-09-24 DIAGNOSIS — U071 COVID-19: Secondary | ICD-10-CM | POA: Diagnosis not present

## 2019-09-24 DIAGNOSIS — I129 Hypertensive chronic kidney disease with stage 1 through stage 4 chronic kidney disease, or unspecified chronic kidney disease: Secondary | ICD-10-CM | POA: Diagnosis not present

## 2019-09-24 DIAGNOSIS — E1122 Type 2 diabetes mellitus with diabetic chronic kidney disease: Secondary | ICD-10-CM | POA: Diagnosis not present

## 2019-09-24 DIAGNOSIS — J80 Acute respiratory distress syndrome: Secondary | ICD-10-CM | POA: Diagnosis not present

## 2019-09-24 DIAGNOSIS — J1289 Other viral pneumonia: Secondary | ICD-10-CM | POA: Diagnosis not present

## 2019-09-27 DIAGNOSIS — E1122 Type 2 diabetes mellitus with diabetic chronic kidney disease: Secondary | ICD-10-CM | POA: Diagnosis not present

## 2019-09-27 DIAGNOSIS — J80 Acute respiratory distress syndrome: Secondary | ICD-10-CM | POA: Diagnosis not present

## 2019-09-27 DIAGNOSIS — U071 COVID-19: Secondary | ICD-10-CM | POA: Diagnosis not present

## 2019-09-27 DIAGNOSIS — J1289 Other viral pneumonia: Secondary | ICD-10-CM | POA: Diagnosis not present

## 2019-09-27 DIAGNOSIS — I129 Hypertensive chronic kidney disease with stage 1 through stage 4 chronic kidney disease, or unspecified chronic kidney disease: Secondary | ICD-10-CM | POA: Diagnosis not present

## 2019-09-29 DIAGNOSIS — J80 Acute respiratory distress syndrome: Secondary | ICD-10-CM | POA: Diagnosis not present

## 2019-09-29 DIAGNOSIS — J1289 Other viral pneumonia: Secondary | ICD-10-CM | POA: Diagnosis not present

## 2019-09-29 DIAGNOSIS — I129 Hypertensive chronic kidney disease with stage 1 through stage 4 chronic kidney disease, or unspecified chronic kidney disease: Secondary | ICD-10-CM | POA: Diagnosis not present

## 2019-09-29 DIAGNOSIS — U071 COVID-19: Secondary | ICD-10-CM | POA: Diagnosis not present

## 2019-09-29 DIAGNOSIS — E1122 Type 2 diabetes mellitus with diabetic chronic kidney disease: Secondary | ICD-10-CM | POA: Diagnosis not present

## 2019-10-04 DIAGNOSIS — E1122 Type 2 diabetes mellitus with diabetic chronic kidney disease: Secondary | ICD-10-CM | POA: Diagnosis not present

## 2019-10-04 DIAGNOSIS — J1289 Other viral pneumonia: Secondary | ICD-10-CM | POA: Diagnosis not present

## 2019-10-04 DIAGNOSIS — J80 Acute respiratory distress syndrome: Secondary | ICD-10-CM | POA: Diagnosis not present

## 2019-10-04 DIAGNOSIS — U071 COVID-19: Secondary | ICD-10-CM | POA: Diagnosis not present

## 2019-10-04 DIAGNOSIS — I129 Hypertensive chronic kidney disease with stage 1 through stage 4 chronic kidney disease, or unspecified chronic kidney disease: Secondary | ICD-10-CM | POA: Diagnosis not present

## 2019-10-06 DIAGNOSIS — J1289 Other viral pneumonia: Secondary | ICD-10-CM | POA: Diagnosis not present

## 2019-10-06 DIAGNOSIS — J80 Acute respiratory distress syndrome: Secondary | ICD-10-CM | POA: Diagnosis not present

## 2019-10-06 DIAGNOSIS — I129 Hypertensive chronic kidney disease with stage 1 through stage 4 chronic kidney disease, or unspecified chronic kidney disease: Secondary | ICD-10-CM | POA: Diagnosis not present

## 2019-10-06 DIAGNOSIS — U071 COVID-19: Secondary | ICD-10-CM | POA: Diagnosis not present

## 2019-10-06 DIAGNOSIS — E1122 Type 2 diabetes mellitus with diabetic chronic kidney disease: Secondary | ICD-10-CM | POA: Diagnosis not present

## 2019-10-11 DIAGNOSIS — E1122 Type 2 diabetes mellitus with diabetic chronic kidney disease: Secondary | ICD-10-CM | POA: Diagnosis not present

## 2019-10-11 DIAGNOSIS — J1289 Other viral pneumonia: Secondary | ICD-10-CM | POA: Diagnosis not present

## 2019-10-11 DIAGNOSIS — U071 COVID-19: Secondary | ICD-10-CM | POA: Diagnosis not present

## 2019-10-11 DIAGNOSIS — J80 Acute respiratory distress syndrome: Secondary | ICD-10-CM | POA: Diagnosis not present

## 2019-10-11 DIAGNOSIS — I129 Hypertensive chronic kidney disease with stage 1 through stage 4 chronic kidney disease, or unspecified chronic kidney disease: Secondary | ICD-10-CM | POA: Diagnosis not present

## 2019-10-13 DIAGNOSIS — E1122 Type 2 diabetes mellitus with diabetic chronic kidney disease: Secondary | ICD-10-CM | POA: Diagnosis not present

## 2019-10-13 DIAGNOSIS — J80 Acute respiratory distress syndrome: Secondary | ICD-10-CM | POA: Diagnosis not present

## 2019-10-13 DIAGNOSIS — J1289 Other viral pneumonia: Secondary | ICD-10-CM | POA: Diagnosis not present

## 2019-10-13 DIAGNOSIS — U071 COVID-19: Secondary | ICD-10-CM | POA: Diagnosis not present

## 2019-10-13 DIAGNOSIS — I129 Hypertensive chronic kidney disease with stage 1 through stage 4 chronic kidney disease, or unspecified chronic kidney disease: Secondary | ICD-10-CM | POA: Diagnosis not present

## 2019-10-18 DIAGNOSIS — E1122 Type 2 diabetes mellitus with diabetic chronic kidney disease: Secondary | ICD-10-CM | POA: Diagnosis not present

## 2019-10-18 DIAGNOSIS — J1289 Other viral pneumonia: Secondary | ICD-10-CM | POA: Diagnosis not present

## 2019-10-18 DIAGNOSIS — J80 Acute respiratory distress syndrome: Secondary | ICD-10-CM | POA: Diagnosis not present

## 2019-10-18 DIAGNOSIS — U071 COVID-19: Secondary | ICD-10-CM | POA: Diagnosis not present

## 2019-10-18 DIAGNOSIS — I129 Hypertensive chronic kidney disease with stage 1 through stage 4 chronic kidney disease, or unspecified chronic kidney disease: Secondary | ICD-10-CM | POA: Diagnosis not present

## 2019-10-20 DIAGNOSIS — H35423 Microcystoid degeneration of retina, bilateral: Secondary | ICD-10-CM | POA: Diagnosis not present

## 2019-10-20 DIAGNOSIS — H353134 Nonexudative age-related macular degeneration, bilateral, advanced atrophic with subfoveal involvement: Secondary | ICD-10-CM | POA: Diagnosis not present

## 2019-10-20 DIAGNOSIS — H43813 Vitreous degeneration, bilateral: Secondary | ICD-10-CM | POA: Diagnosis not present

## 2019-10-25 DIAGNOSIS — K7689 Other specified diseases of liver: Secondary | ICD-10-CM | POA: Diagnosis not present

## 2019-10-25 DIAGNOSIS — E039 Hypothyroidism, unspecified: Secondary | ICD-10-CM | POA: Diagnosis not present

## 2019-10-25 DIAGNOSIS — E119 Type 2 diabetes mellitus without complications: Secondary | ICD-10-CM | POA: Diagnosis not present

## 2019-11-01 DIAGNOSIS — R21 Rash and other nonspecific skin eruption: Secondary | ICD-10-CM | POA: Diagnosis not present

## 2019-11-01 DIAGNOSIS — E039 Hypothyroidism, unspecified: Secondary | ICD-10-CM | POA: Diagnosis not present

## 2019-11-01 DIAGNOSIS — K7689 Other specified diseases of liver: Secondary | ICD-10-CM | POA: Diagnosis not present

## 2019-11-01 DIAGNOSIS — E119 Type 2 diabetes mellitus without complications: Secondary | ICD-10-CM | POA: Diagnosis not present

## 2019-11-19 DIAGNOSIS — J80 Acute respiratory distress syndrome: Secondary | ICD-10-CM | POA: Diagnosis not present

## 2019-11-19 DIAGNOSIS — U071 COVID-19: Secondary | ICD-10-CM | POA: Diagnosis not present

## 2019-11-19 DIAGNOSIS — J1289 Other viral pneumonia: Secondary | ICD-10-CM | POA: Diagnosis not present

## 2019-11-19 DIAGNOSIS — I129 Hypertensive chronic kidney disease with stage 1 through stage 4 chronic kidney disease, or unspecified chronic kidney disease: Secondary | ICD-10-CM | POA: Diagnosis not present

## 2020-01-31 DIAGNOSIS — E119 Type 2 diabetes mellitus without complications: Secondary | ICD-10-CM | POA: Diagnosis not present

## 2020-01-31 DIAGNOSIS — K7689 Other specified diseases of liver: Secondary | ICD-10-CM | POA: Diagnosis not present

## 2020-01-31 DIAGNOSIS — E039 Hypothyroidism, unspecified: Secondary | ICD-10-CM | POA: Diagnosis not present

## 2020-02-12 IMAGING — DX DG CHEST 1V PORT
1 series · 1 of 1 positions shown · non-contrast
Comparison: 01/24/2016

CLINICAL DATA: [id number] positive.  Weakness

EXAM:
PORTABLE CHEST 1 VIEW

[chest ap]
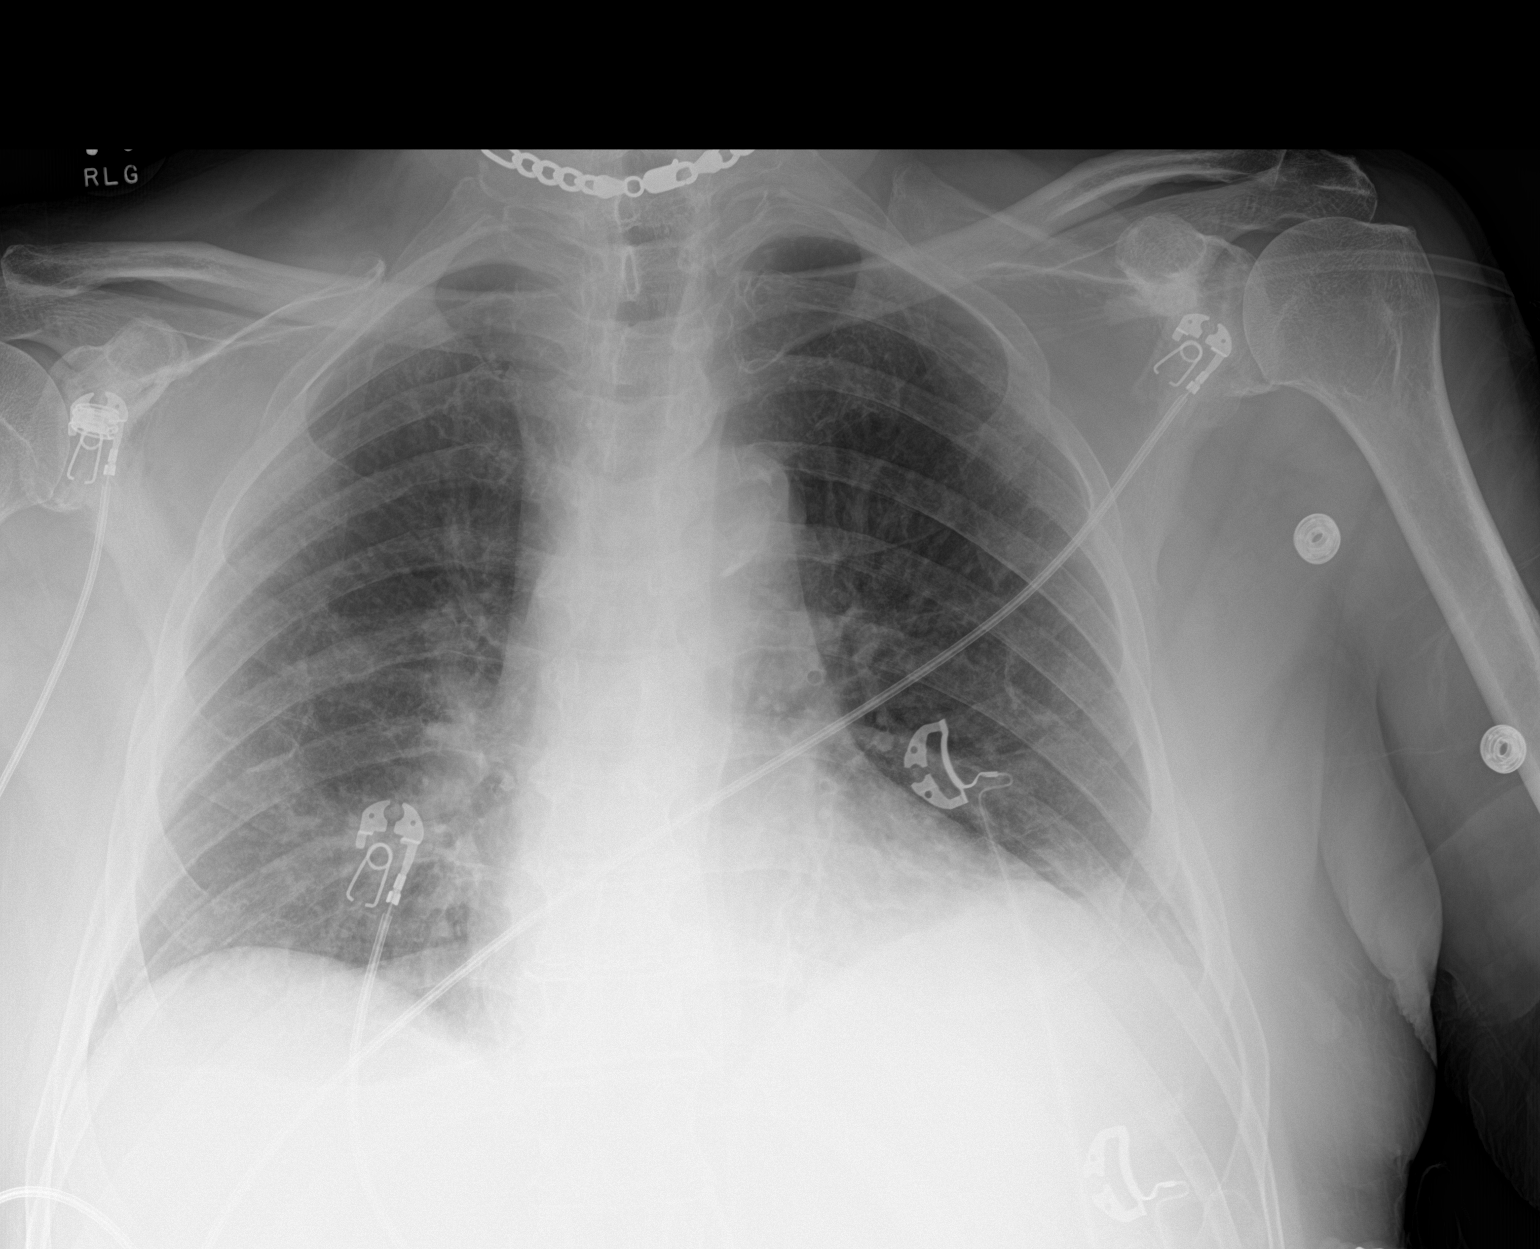

[1 of 1 positions shown; findings below may reference images not displayed]

FINDINGS: The heart size and mediastinal contours are within normal limits.
Chronic left basilar scarring, unchanged from prior. No new focal
airspace consolidation. No pleural effusion. No pneumothorax. No
acute osseous findings.
IMPRESSION: Chronic left basilar scarring.  Lungs appear otherwise clear.

## 2020-04-19 DIAGNOSIS — H43822 Vitreomacular adhesion, left eye: Secondary | ICD-10-CM | POA: Diagnosis not present

## 2020-04-19 DIAGNOSIS — H35423 Microcystoid degeneration of retina, bilateral: Secondary | ICD-10-CM | POA: Diagnosis not present

## 2020-04-19 DIAGNOSIS — H353134 Nonexudative age-related macular degeneration, bilateral, advanced atrophic with subfoveal involvement: Secondary | ICD-10-CM | POA: Diagnosis not present

## 2020-04-19 DIAGNOSIS — H43813 Vitreous degeneration, bilateral: Secondary | ICD-10-CM | POA: Diagnosis not present

## 2020-07-17 DIAGNOSIS — H353134 Nonexudative age-related macular degeneration, bilateral, advanced atrophic with subfoveal involvement: Secondary | ICD-10-CM | POA: Diagnosis not present

## 2020-07-17 DIAGNOSIS — H35423 Microcystoid degeneration of retina, bilateral: Secondary | ICD-10-CM | POA: Diagnosis not present

## 2020-07-17 DIAGNOSIS — H43823 Vitreomacular adhesion, bilateral: Secondary | ICD-10-CM | POA: Diagnosis not present

## 2020-07-17 DIAGNOSIS — H35371 Puckering of macula, right eye: Secondary | ICD-10-CM | POA: Diagnosis not present

## 2020-12-04 DIAGNOSIS — H353134 Nonexudative age-related macular degeneration, bilateral, advanced atrophic with subfoveal involvement: Secondary | ICD-10-CM | POA: Diagnosis not present

## 2020-12-04 DIAGNOSIS — H43813 Vitreous degeneration, bilateral: Secondary | ICD-10-CM | POA: Diagnosis not present

## 2020-12-04 DIAGNOSIS — H35373 Puckering of macula, bilateral: Secondary | ICD-10-CM | POA: Diagnosis not present

## 2020-12-04 DIAGNOSIS — H35423 Microcystoid degeneration of retina, bilateral: Secondary | ICD-10-CM | POA: Diagnosis not present

## 2020-12-29 DIAGNOSIS — N1831 Chronic kidney disease, stage 3a: Secondary | ICD-10-CM | POA: Diagnosis not present

## 2020-12-29 DIAGNOSIS — Z8616 Personal history of COVID-19: Secondary | ICD-10-CM | POA: Diagnosis not present

## 2020-12-29 DIAGNOSIS — M25571 Pain in right ankle and joints of right foot: Secondary | ICD-10-CM | POA: Diagnosis not present

## 2020-12-29 DIAGNOSIS — I129 Hypertensive chronic kidney disease with stage 1 through stage 4 chronic kidney disease, or unspecified chronic kidney disease: Secondary | ICD-10-CM | POA: Diagnosis not present

## 2020-12-29 DIAGNOSIS — E039 Hypothyroidism, unspecified: Secondary | ICD-10-CM | POA: Diagnosis not present

## 2020-12-29 DIAGNOSIS — E119 Type 2 diabetes mellitus without complications: Secondary | ICD-10-CM | POA: Diagnosis not present

## 2020-12-29 DIAGNOSIS — E785 Hyperlipidemia, unspecified: Secondary | ICD-10-CM | POA: Diagnosis not present

## 2020-12-29 DIAGNOSIS — K760 Fatty (change of) liver, not elsewhere classified: Secondary | ICD-10-CM | POA: Diagnosis not present

## 2021-01-15 DIAGNOSIS — H353134 Nonexudative age-related macular degeneration, bilateral, advanced atrophic with subfoveal involvement: Secondary | ICD-10-CM | POA: Diagnosis not present

## 2021-01-15 DIAGNOSIS — R748 Abnormal levels of other serum enzymes: Secondary | ICD-10-CM | POA: Diagnosis not present

## 2021-01-15 DIAGNOSIS — L309 Dermatitis, unspecified: Secondary | ICD-10-CM | POA: Diagnosis not present

## 2021-01-15 DIAGNOSIS — N1831 Chronic kidney disease, stage 3a: Secondary | ICD-10-CM | POA: Diagnosis not present

## 2021-01-15 DIAGNOSIS — E785 Hyperlipidemia, unspecified: Secondary | ICD-10-CM | POA: Diagnosis not present

## 2021-01-15 DIAGNOSIS — E119 Type 2 diabetes mellitus without complications: Secondary | ICD-10-CM | POA: Diagnosis not present

## 2021-01-15 DIAGNOSIS — K760 Fatty (change of) liver, not elsewhere classified: Secondary | ICD-10-CM | POA: Diagnosis not present

## 2021-01-15 DIAGNOSIS — E039 Hypothyroidism, unspecified: Secondary | ICD-10-CM | POA: Diagnosis not present

## 2021-04-02 DIAGNOSIS — H353134 Nonexudative age-related macular degeneration, bilateral, advanced atrophic with subfoveal involvement: Secondary | ICD-10-CM | POA: Diagnosis not present

## 2021-04-02 DIAGNOSIS — H43823 Vitreomacular adhesion, bilateral: Secondary | ICD-10-CM | POA: Diagnosis not present

## 2021-04-02 DIAGNOSIS — H35423 Microcystoid degeneration of retina, bilateral: Secondary | ICD-10-CM | POA: Diagnosis not present

## 2021-04-02 DIAGNOSIS — H35373 Puckering of macula, bilateral: Secondary | ICD-10-CM | POA: Diagnosis not present

## 2021-05-21 DIAGNOSIS — R4789 Other speech disturbances: Secondary | ICD-10-CM | POA: Diagnosis not present

## 2021-05-21 DIAGNOSIS — M25571 Pain in right ankle and joints of right foot: Secondary | ICD-10-CM | POA: Diagnosis not present

## 2021-05-21 DIAGNOSIS — E039 Hypothyroidism, unspecified: Secondary | ICD-10-CM | POA: Diagnosis not present

## 2021-05-21 DIAGNOSIS — M47812 Spondylosis without myelopathy or radiculopathy, cervical region: Secondary | ICD-10-CM | POA: Diagnosis not present

## 2021-05-21 DIAGNOSIS — L309 Dermatitis, unspecified: Secondary | ICD-10-CM | POA: Diagnosis not present

## 2021-05-21 DIAGNOSIS — N1831 Chronic kidney disease, stage 3a: Secondary | ICD-10-CM | POA: Diagnosis not present

## 2021-05-21 DIAGNOSIS — H353134 Nonexudative age-related macular degeneration, bilateral, advanced atrophic with subfoveal involvement: Secondary | ICD-10-CM | POA: Diagnosis not present

## 2021-05-21 DIAGNOSIS — M109 Gout, unspecified: Secondary | ICD-10-CM | POA: Diagnosis not present

## 2021-05-21 DIAGNOSIS — Z1389 Encounter for screening for other disorder: Secondary | ICD-10-CM | POA: Diagnosis not present

## 2021-05-21 DIAGNOSIS — I129 Hypertensive chronic kidney disease with stage 1 through stage 4 chronic kidney disease, or unspecified chronic kidney disease: Secondary | ICD-10-CM | POA: Diagnosis not present

## 2021-05-21 DIAGNOSIS — E119 Type 2 diabetes mellitus without complications: Secondary | ICD-10-CM | POA: Diagnosis not present

## 2021-05-21 DIAGNOSIS — Z13828 Encounter for screening for other musculoskeletal disorder: Secondary | ICD-10-CM | POA: Diagnosis not present

## 2021-05-21 DIAGNOSIS — E785 Hyperlipidemia, unspecified: Secondary | ICD-10-CM | POA: Diagnosis not present

## 2021-05-21 DIAGNOSIS — K760 Fatty (change of) liver, not elsewhere classified: Secondary | ICD-10-CM | POA: Diagnosis not present

## 2021-05-21 DIAGNOSIS — R748 Abnormal levels of other serum enzymes: Secondary | ICD-10-CM | POA: Diagnosis not present

## 2021-05-24 ENCOUNTER — Other Ambulatory Visit: Payer: Self-pay | Admitting: Internal Medicine

## 2021-05-24 DIAGNOSIS — R7989 Other specified abnormal findings of blood chemistry: Secondary | ICD-10-CM

## 2021-06-15 ENCOUNTER — Other Ambulatory Visit: Payer: Medicare Other

## 2021-06-27 ENCOUNTER — Ambulatory Visit
Admission: RE | Admit: 2021-06-27 | Discharge: 2021-06-27 | Disposition: A | Discharge status: Home / Self Care | Payer: Medicare Other | Source: Ambulatory Visit | Attending: Internal Medicine | Admitting: Internal Medicine

## 2021-06-27 DIAGNOSIS — K7689 Other specified diseases of liver: Secondary | ICD-10-CM | POA: Diagnosis not present

## 2021-06-27 DIAGNOSIS — R7989 Other specified abnormal findings of blood chemistry: Secondary | ICD-10-CM

## 2021-06-27 DIAGNOSIS — K8020 Calculus of gallbladder without cholecystitis without obstruction: Secondary | ICD-10-CM | POA: Diagnosis not present

## 2021-06-27 DIAGNOSIS — R945 Abnormal results of liver function studies: Secondary | ICD-10-CM

## 2021-09-03 DIAGNOSIS — E039 Hypothyroidism, unspecified: Secondary | ICD-10-CM | POA: Diagnosis not present

## 2021-09-03 DIAGNOSIS — E785 Hyperlipidemia, unspecified: Secondary | ICD-10-CM | POA: Diagnosis not present

## 2021-09-03 DIAGNOSIS — M109 Gout, unspecified: Secondary | ICD-10-CM | POA: Diagnosis not present

## 2021-09-03 DIAGNOSIS — E119 Type 2 diabetes mellitus without complications: Secondary | ICD-10-CM | POA: Diagnosis not present

## 2021-09-10 DIAGNOSIS — D692 Other nonthrombocytopenic purpura: Secondary | ICD-10-CM | POA: Diagnosis not present

## 2021-09-10 DIAGNOSIS — I2721 Secondary pulmonary arterial hypertension: Secondary | ICD-10-CM | POA: Diagnosis not present

## 2021-09-10 DIAGNOSIS — E785 Hyperlipidemia, unspecified: Secondary | ICD-10-CM | POA: Diagnosis not present

## 2021-09-10 DIAGNOSIS — Z0000 Encounter for general adult medical examination without abnormal findings: Secondary | ICD-10-CM | POA: Diagnosis not present

## 2021-09-10 DIAGNOSIS — K760 Fatty (change of) liver, not elsewhere classified: Secondary | ICD-10-CM | POA: Diagnosis not present

## 2021-09-10 DIAGNOSIS — E119 Type 2 diabetes mellitus without complications: Secondary | ICD-10-CM | POA: Diagnosis not present

## 2021-09-10 DIAGNOSIS — E039 Hypothyroidism, unspecified: Secondary | ICD-10-CM | POA: Diagnosis not present

## 2021-09-10 DIAGNOSIS — Z13828 Encounter for screening for other musculoskeletal disorder: Secondary | ICD-10-CM | POA: Diagnosis not present

## 2021-09-10 DIAGNOSIS — M47812 Spondylosis without myelopathy or radiculopathy, cervical region: Secondary | ICD-10-CM | POA: Diagnosis not present

## 2021-09-10 DIAGNOSIS — N1831 Chronic kidney disease, stage 3a: Secondary | ICD-10-CM | POA: Diagnosis not present

## 2021-09-10 DIAGNOSIS — R82998 Other abnormal findings in urine: Secondary | ICD-10-CM | POA: Diagnosis not present

## 2021-11-24 IMAGING — US US ABDOMEN COMPLETE
1 series · 14 of 25 positions shown · non-contrast
Comparison: Ultrasound 12/12/2017

CLINICAL DATA: Abnormal liver function test

EXAM:
ABDOMEN ULTRASOUND COMPLETE

[Series 1: us abdomen complete · 0.17mm/px · 14 of 83 slices shown]
[im 1/83]
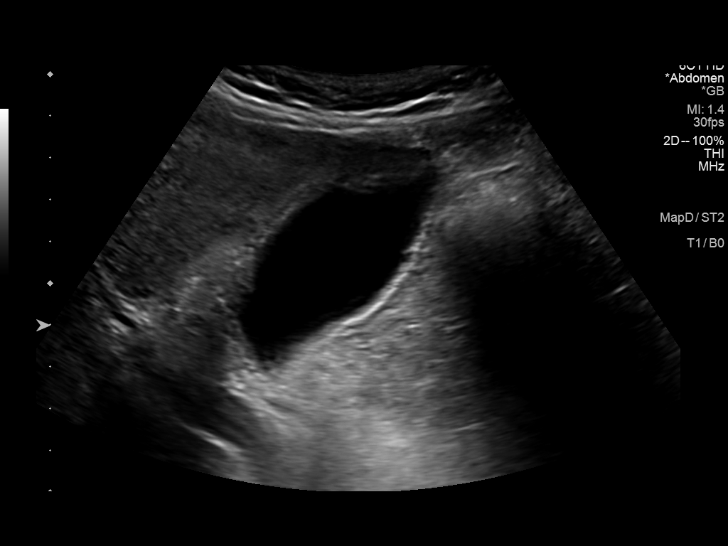
[im 7/83]
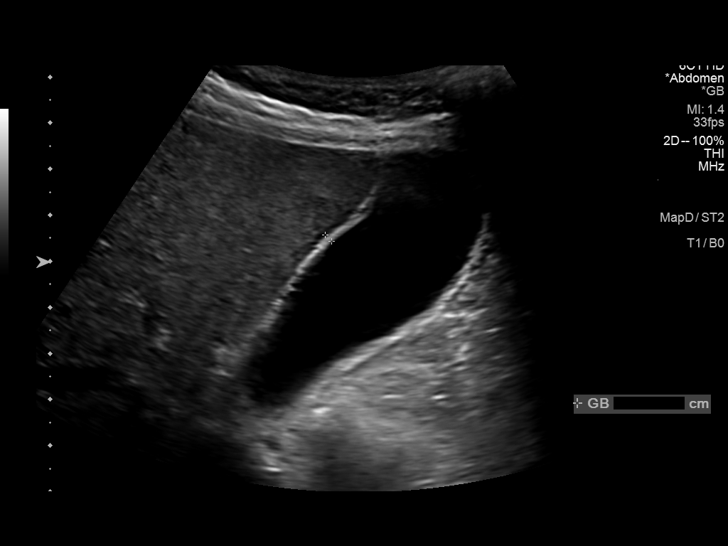
[im 14/83]
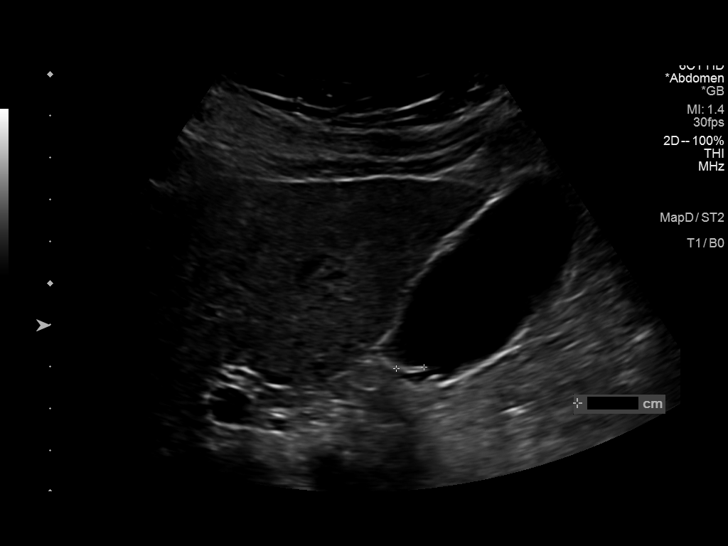
[im 21/83]
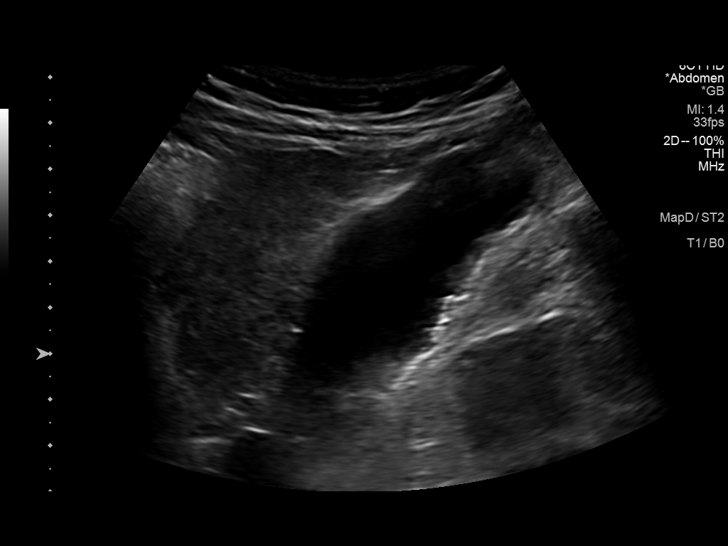
[im 28/83]
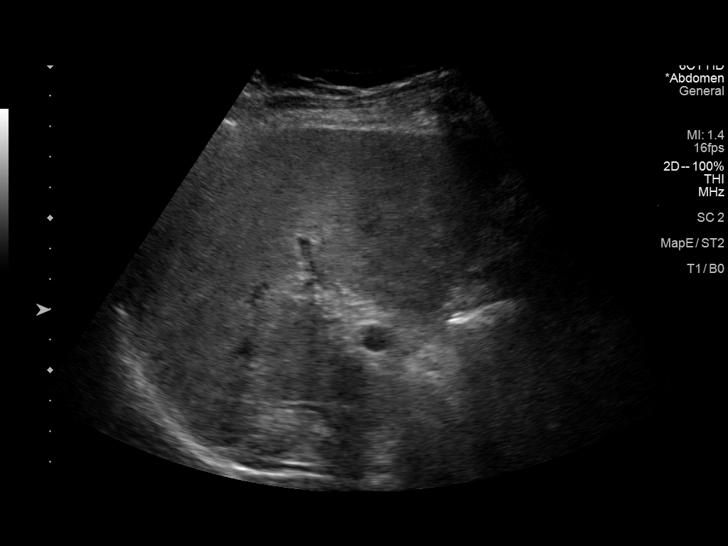
[im 31/83]
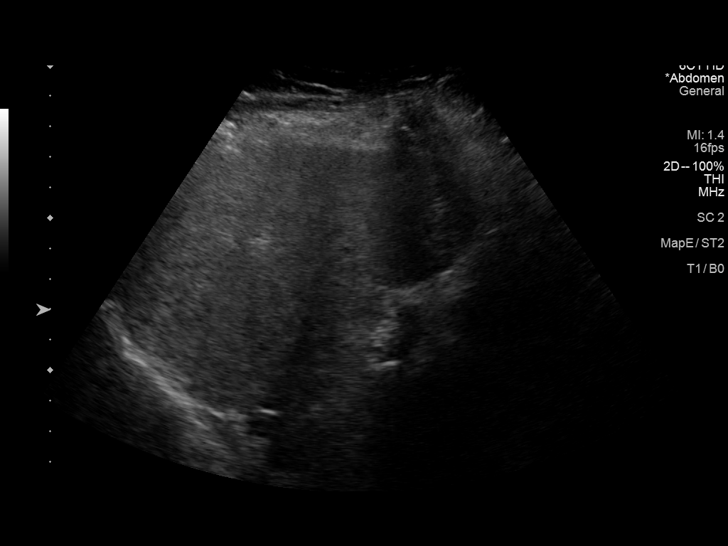
[im 38/83]
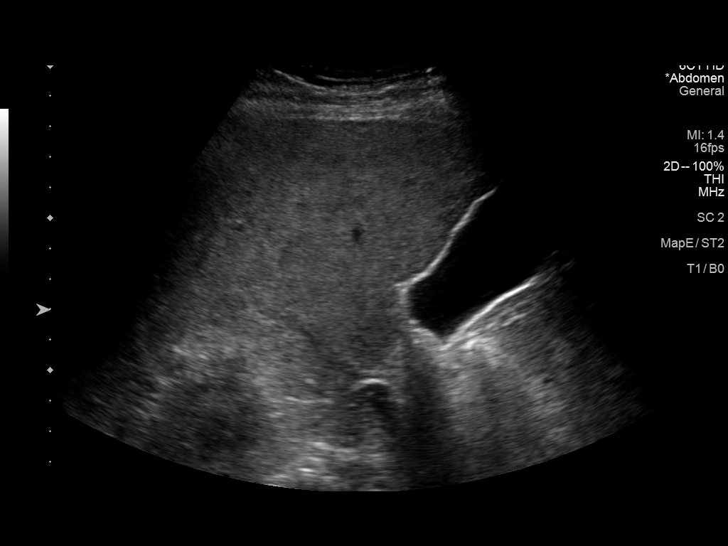
[im 45/83]
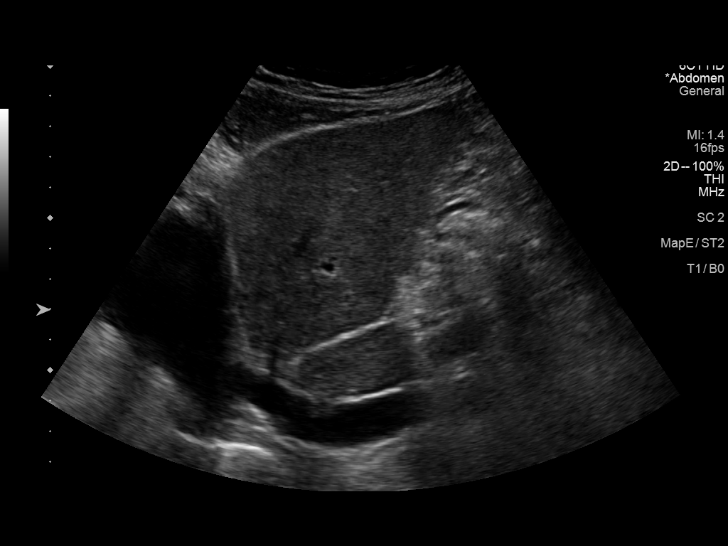
[im 52/83]
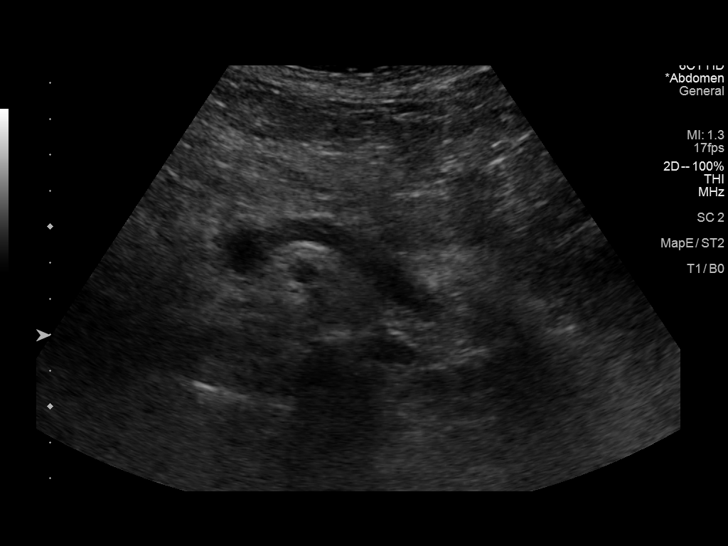
[im 55/83]
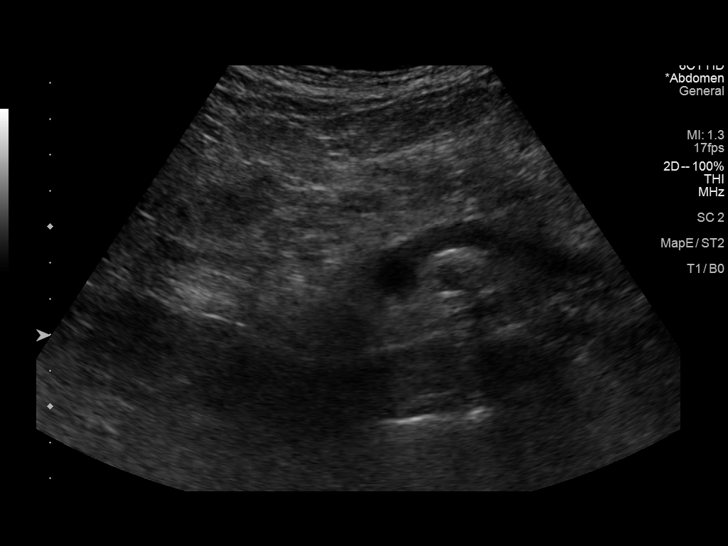
[im 62/83]
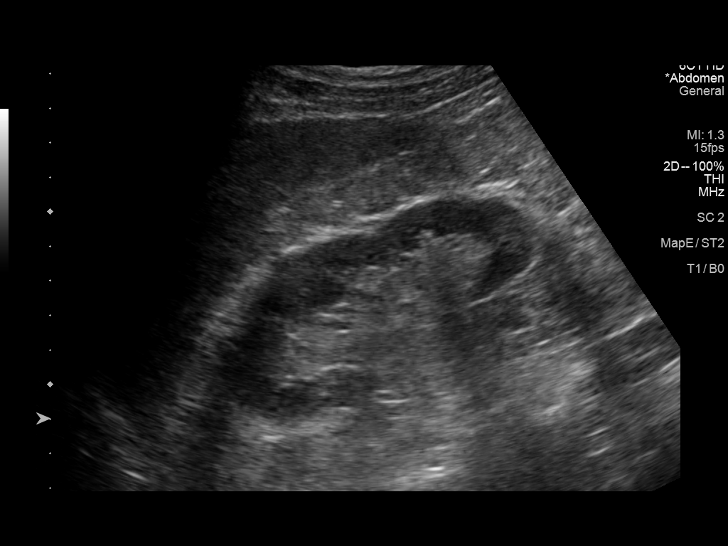
[im 69/83]
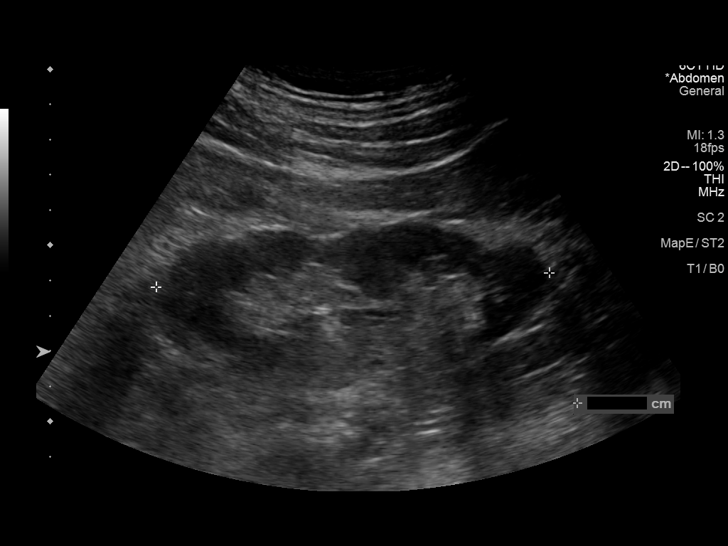
[im 76/83]
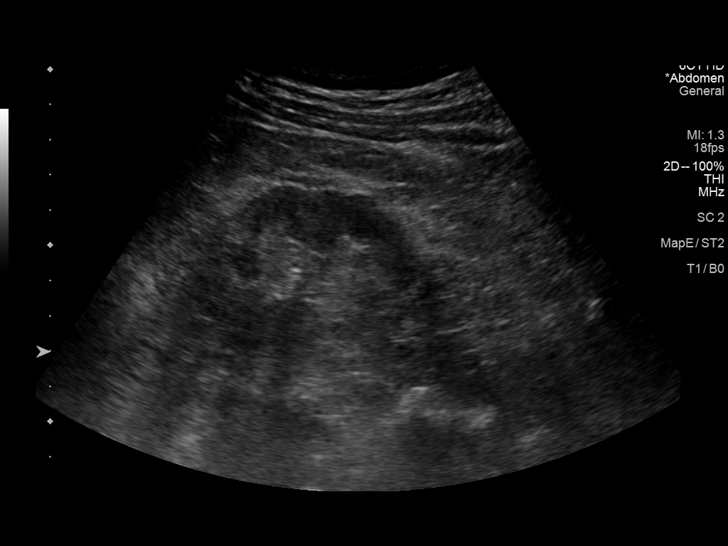
[im 83/83]
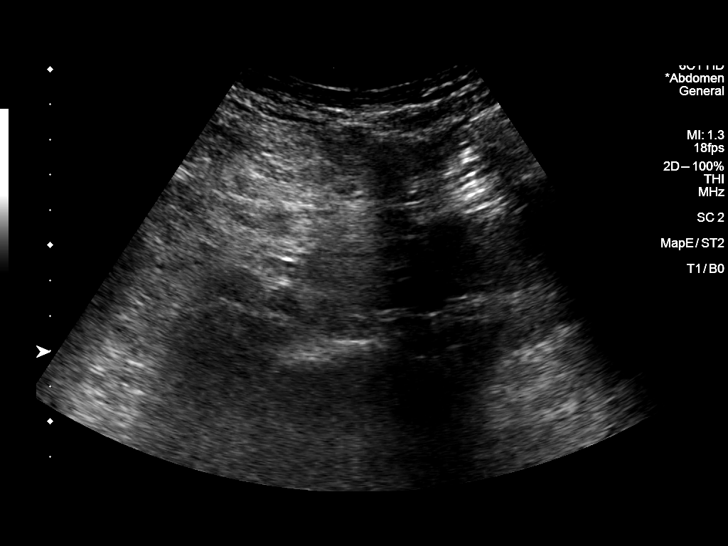

[14 of 25 positions shown; findings below may reference images not displayed]

FINDINGS: Gallbladder: Small stone measuring up to 6.6 mm. Normal wall
thickness. Negative sonographic Murphy.

Common bile duct: Diameter: 2.2 mm

Liver: Heterogeneous increased echogenicity. Possible nodularity of
parenchyma. No focal hepatic abnormality. Portal vein is patent on
color Doppler imaging with normal direction of blood flow towards
the liver.

IVC: No abnormality visualized.

Pancreas: Visualized portion unremarkable.

Spleen: Size and appearance within normal limits.

Right Kidney: Length: 10 cm. Echogenicity within normal limits. No
mass or hydronephrosis visualized.

Left Kidney: Length: 11.2 cm. Echogenicity within normal limits. No
mass or hydronephrosis visualized.

Abdominal aorta: No aneurysm visualized.

Other findings: None.
IMPRESSION: 1. Heterogenous increased echogenicity of the liver consistent with
hepatocellular disease and or steatosis. Possible subtle contour
nodularity as may be seen with early cirrhosis.
2. Gallstones
